# Patient Record
Sex: Female | Born: 1965 | Race: White | Hispanic: No | Smoking: Current every day smoker
Health system: Southern US, Community
[De-identification: ages and names within clinical notes are randomized; demographics above are authoritative.]

## PROBLEM LIST (undated history)

## (undated) VITALS — BP 128/84 | HR 73 | Temp 97.4°F | Resp 16 | Ht 60.0 in | Wt 152.0 lb

## (undated) DIAGNOSIS — F32A Depression, unspecified: Secondary | ICD-10-CM

## (undated) DIAGNOSIS — F1010 Alcohol abuse, uncomplicated: Secondary | ICD-10-CM

## (undated) DIAGNOSIS — F329 Major depressive disorder, single episode, unspecified: Secondary | ICD-10-CM

## (undated) DIAGNOSIS — I509 Heart failure, unspecified: Secondary | ICD-10-CM

## (undated) DIAGNOSIS — E785 Hyperlipidemia, unspecified: Secondary | ICD-10-CM

## (undated) DIAGNOSIS — J449 Chronic obstructive pulmonary disease, unspecified: Secondary | ICD-10-CM

## (undated) DIAGNOSIS — R011 Cardiac murmur, unspecified: Secondary | ICD-10-CM

## (undated) HISTORY — PX: EXPLORATION POST OPERATIVE OPEN HEART: SHX5061

## (undated) HISTORY — DX: Chronic obstructive pulmonary disease, unspecified: J44.9

## (undated) HISTORY — PX: DILATION AND CURETTAGE OF UTERUS: SHX78

## (undated) HISTORY — DX: Cardiac murmur, unspecified: R01.1

## (undated) HISTORY — DX: Heart failure, unspecified: I50.9

## (undated) HISTORY — DX: Hyperlipidemia, unspecified: E78.5

---

## 2000-06-08 ENCOUNTER — Emergency Department (HOSPITAL_COMMUNITY): Admission: EM | Admit: 2000-06-08 | Discharge: 2000-06-09 | Payer: Self-pay | Admitting: *Deleted

## 2004-09-10 ENCOUNTER — Ambulatory Visit: Payer: Self-pay | Admitting: Psychiatry

## 2004-10-31 ENCOUNTER — Inpatient Hospital Stay: Payer: Self-pay | Admitting: Psychiatry

## 2004-11-05 ENCOUNTER — Ambulatory Visit: Payer: Self-pay | Admitting: Unknown Physician Specialty

## 2009-05-31 ENCOUNTER — Emergency Department (HOSPITAL_COMMUNITY): Admission: EM | Admit: 2009-05-31 | Discharge: 2009-06-01 | Payer: Self-pay | Admitting: Emergency Medicine

## 2009-06-01 ENCOUNTER — Ambulatory Visit: Payer: Self-pay | Admitting: Psychiatry

## 2009-06-01 ENCOUNTER — Inpatient Hospital Stay (HOSPITAL_COMMUNITY): Admission: AD | Admit: 2009-06-01 | Discharge: 2009-06-07 | Payer: Self-pay | Admitting: Psychiatry

## 2010-03-23 LAB — HEPATIC FUNCTION PANEL
ALT: 48 U/L — ABNORMAL HIGH (ref 0–35)
AST: 41 U/L — ABNORMAL HIGH (ref 0–37)
Bilirubin, Direct: 0.1 mg/dL (ref 0.0–0.3)
Total Bilirubin: 0.5 mg/dL (ref 0.3–1.2)

## 2010-03-23 LAB — BASIC METABOLIC PANEL
GFR calc Af Amer: >60 mL/min (ref >60)
GFR calc non Af Amer: >60 mL/min (ref >60)
Potassium: 3.6 mEq/L (ref 3.5–5.1)
Sodium: 136 mEq/L (ref 135–145)

## 2010-03-23 LAB — CBC
HCT: 41.1 % (ref 36.0–46.0)
Hemoglobin: 14.2 g/dL (ref 12.0–15.0)
Platelets: 190 10*3/uL (ref 150–400)
WBC: 8.2 10*3/uL (ref 4.0–10.5)

## 2010-03-23 LAB — DIFFERENTIAL
Eosinophils Relative: 1 % (ref 0–5)
Lymphocytes Relative: 39 % (ref 12–46)
Lymphs Abs: 3.1 10*3/uL (ref 0.7–4.0)
Monocytes Absolute: 0.2 10*3/uL (ref 0.1–1.0)

## 2010-03-23 LAB — ETHANOL: Alcohol, Ethyl (B): 156 mg/dL — ABNORMAL HIGH (ref 0–10)

## 2010-03-23 LAB — RAPID URINE DRUG SCREEN, HOSP PERFORMED: Tetrahydrocannabinol: NOT DETECTED

## 2010-03-23 LAB — ACETAMINOPHEN LEVEL: Acetaminophen (Tylenol), Serum: <10 ug/mL — ABNORMAL LOW (ref 10–30)

## 2010-09-10 ENCOUNTER — Other Ambulatory Visit: Payer: Self-pay

## 2010-09-10 ENCOUNTER — Inpatient Hospital Stay (HOSPITAL_COMMUNITY): Admission: AD | Admit: 2010-09-10 | Payer: 59 | Admitting: Psychiatry

## 2010-09-10 ENCOUNTER — Emergency Department (HOSPITAL_COMMUNITY)
Admission: EM | Admit: 2010-09-10 | Discharge: 2010-09-10 | Disposition: A | Discharge status: Home / Self Care | Payer: Self-pay | Attending: Emergency Medicine | Admitting: Emergency Medicine

## 2010-09-10 DIAGNOSIS — F1010 Alcohol abuse, uncomplicated: Secondary | ICD-10-CM

## 2010-09-10 DIAGNOSIS — F329 Major depressive disorder, single episode, unspecified: Secondary | ICD-10-CM | POA: Insufficient documentation

## 2010-09-10 DIAGNOSIS — F3289 Other specified depressive episodes: Secondary | ICD-10-CM | POA: Insufficient documentation

## 2010-09-10 DIAGNOSIS — R45851 Suicidal ideations: Secondary | ICD-10-CM

## 2010-09-10 DIAGNOSIS — F10929 Alcohol use, unspecified with intoxication, unspecified: Secondary | ICD-10-CM | POA: Diagnosis present

## 2010-09-10 DIAGNOSIS — F32A Depression, unspecified: Secondary | ICD-10-CM

## 2010-09-10 HISTORY — DX: Depression, unspecified: F32.A

## 2010-09-10 HISTORY — DX: Major depressive disorder, single episode, unspecified: F32.9

## 2010-09-10 HISTORY — DX: Alcohol abuse, uncomplicated: F10.10

## 2010-09-10 LAB — COMPREHENSIVE METABOLIC PANEL
ALT: 15 U/L (ref 0–35)
Alkaline Phosphatase: 78 U/L (ref 39–117)
CO2: 23 mEq/L (ref 19–32)
Calcium: 8.3 mg/dL — ABNORMAL LOW (ref 8.4–10.5)
Chloride: 108 mEq/L (ref 96–112)
GFR calc Af Amer: >60 mL/min (ref >60)
GFR calc non Af Amer: >60 mL/min (ref >60)
Glucose, Bld: 97 mg/dL (ref 70–99)
Sodium: 145 mEq/L (ref 135–145)
Total Bilirubin: 0.1 mg/dL — ABNORMAL LOW (ref 0.3–1.2)

## 2010-09-10 LAB — RAPID URINE DRUG SCREEN, HOSP PERFORMED
Barbiturates: NOT DETECTED
Opiates: NOT DETECTED
Tetrahydrocannabinol: NOT DETECTED

## 2010-09-10 LAB — CBC
Hemoglobin: 13 g/dL (ref 12.0–15.0)
MCH: 34 pg (ref 26.0–34.0)
RBC: 3.82 MIL/uL — ABNORMAL LOW (ref 3.87–5.11)

## 2010-09-10 LAB — GLUCOSE, CAPILLARY: Glucose-Capillary: 105 mg/dL — ABNORMAL HIGH (ref 70–99)

## 2010-09-10 LAB — ETHANOL: Alcohol, Ethyl (B): 374 mg/dL — ABNORMAL HIGH (ref 0–11)

## 2010-09-10 MED ORDER — ALUM & MAG HYDROXIDE-SIMETH 200-200-20 MG/5ML PO SUSP: 30.0000 mL | ORAL | Status: DC | PRN

## 2010-09-10 MED ORDER — ZOLPIDEM TARTRATE 5 MG PO TABS: 10.0000 mg | ORAL_TABLET | Freq: Every evening | ORAL | Status: DC | PRN

## 2010-09-10 MED ORDER — ZIPRASIDONE MESYLATE 20 MG IM SOLR
INTRAMUSCULAR | Status: AC
  Administered 2010-09-10: 20 mg via INTRAMUSCULAR
  Filled 2010-09-10: qty 20

## 2010-09-10 MED ORDER — IBUPROFEN 400 MG PO TABS: 600.0000 mg | ORAL_TABLET | Freq: Three times a day (TID) | ORAL | Status: DC | PRN

## 2010-09-10 MED ORDER — ONDANSETRON HCL 4 MG PO TABS: 4.0000 mg | ORAL_TABLET | Freq: Three times a day (TID) | ORAL | Status: DC | PRN

## 2010-09-10 MED ORDER — ACETAMINOPHEN 325 MG PO TABS: 650.0000 mg | ORAL_TABLET | ORAL | Status: DC | PRN

## 2010-09-10 MED ORDER — NICOTINE 21 MG/24HR TD PT24: 21.0000 mg | MEDICATED_PATCH | Freq: Every day | TRANSDERMAL | Status: DC

## 2010-09-10 MED ORDER — ZIPRASIDONE MESYLATE 20 MG IM SOLR
20.0000 mg | Freq: Once | INTRAMUSCULAR | Status: AC
  Administered 2010-09-10: 20 mg via INTRAMUSCULAR

## 2010-09-10 MED ORDER — LORAZEPAM 1 MG PO TABS: 1.0000 mg | ORAL_TABLET | Freq: Three times a day (TID) | ORAL | Status: DC | PRN

## 2010-09-10 MED ORDER — SODIUM CHLORIDE 0.9 % IV BOLUS (SEPSIS)
1000.0000 mL | Freq: Once | INTRAVENOUS | Status: AC
  Administered 2010-09-10: 1000 mL via INTRAVENOUS

## 2010-09-10 MED ORDER — SODIUM CHLORIDE 0.9 % IV SOLN: INTRAVENOUS | Status: DC

## 2010-09-10 NOTE — ED Notes (Signed)
Pt taken out of restraints. Verbalizes understanding of why they were placed. Agrees to not pull at IV and foley catheter.

## 2010-09-10 NOTE — ED Notes (Signed)
edp in with pt 

## 2010-09-10 NOTE — ED Provider Notes (Cosign Needed Addendum)
History   Chart scribed for Ward Givens, MD by Enos Fling; the patient was seen in room APA02/APA02; this patient's care was started at 11:30 AM.    CSN: [id number] Arrival date & time: 09/10/2010 11:21 AM  Chief Complaint  Patient presents with  . Drug Overdose   HPI - Pt seen at 11:30 AM. Level 5 caveat for pt's mental status and uncooperativeness. Alison Kennedy is a 45 y.o. female BIB parents who presents to the Emergency Department for medical clearance. Mother reports pt called her this AM tearful and asking for help. Mom reports pt has been drinking alcohol this AM but unsure if pt has taken any other drugs or meds today. Pt h/o depression. Parents state pt has been admitted for psychiatric reasons twice previously, once at [facility] and once at [facility], most recently in June for 30 days. MOP not aware of any drug use.  Past Medical History  Diagnosis Date  . Depression   . Alcohol abuse    PMH Depression, alcoholism.   History reviewed. No pertinent past surgical history.  No family history on file.  History  Substance Use Topics  . Smoking status: amokes  . Smokeless tobacco: Not on file  . Alcohol Use: heavy  Drug and alcohol use Employed, Divorced OB History    Grav Para Term Preterm Abortions TAB SAB Ect Mult Living                  Review of Systems  Unable to perform ROS: Psychiatric disorder    Physical Exam  BP 92/56  Pulse 76  Temp(Src) 97.9 F (36.6 C) (Oral)  Resp 16  SpO2 100%  Physical Exam  Constitutional: She appears well-developed and well-nourished.  HENT:  Head: Normocephalic and atraumatic.  Eyes: Conjunctivae and EOM are normal. Pupils are equal, round, and reactive to light.  Neck: Normal range of motion.  Cardiovascular: Normal rate, regular rhythm and normal heart sounds.   Pulmonary/Chest: Effort normal and breath sounds normal.  Abdominal: Soft.  Musculoskeletal: Normal range of motion.  Neurological: She is alert.  She has normal strength. No cranial nerve deficit.  Skin: Skin is warm and dry.  Psychiatric: Her speech is normal. Her affect is labile. She is agitated and aggressive. Cognition and memory are impaired.       Pt is screaming for her parents who are at her bedside, crying,  combative, is not consolable, will not answer questions directed at her    ED Course  Procedures  OTHER DATA REVIEWED: Nursing notes and vital signs reviewed. Prior records reviewed.    Date: 09/10/2010  Rate: 100  Rhythm: normal sinus rhythm  QRS Axis: right  Intervals: IRBBB  ST/T Wave abnormalities: nonspecific ST/T changes  Conduction Disutrbances:nonspecific intraventricular conduction delay  Narrative Interpretation:   Old EKG Reviewed: unchanged since 05/31/2009   LABS / RADIOLOGY:   ED COURSE: 3:40 PM - Pt recheck, pt awake and calm at this time, states she feels drowsy and does not remember what happened today. Results discussed, pt reports when she drinks she usually drink an entire bottle of liquor (a fifth). Pt admits to h/o SI, would take pills to "go to sleep and not wake up," mom reports pt h/o OD on pills June 2011 and was admitted to [facility] for 3 weeks at that time. Denies HI or hallucinations. Sees psychiatrist Tiburcio Pea) and a therapist approx once a month. MOP keeps repeating "She is living in the past". States patient  hasn't recovered from her parents divorce 30 years ago and her own divorce many years ago.   3:54 PM - Spoke with Tommy with [facility], Samson Frederic with [facility] will see pt in ED. States her ETOH will need to be down to about 150 before she can be placed anywhere.     MDM: Results for orders placed during the hospital encounter of 09/10/10  CBC      Component Value Range   WBC 7.0  4.0 - 10.5 (K/uL)   RBC 3.82 (*) 3.87 - 5.11 (MIL/uL)   Hemoglobin 13.0  12.0 - 15.0 (g/dL)   HCT 16.1  09.6 - 04.5 (%)   MCV 101.0 (*) 78.0 - 100.0 (fL)   MCH 34.0  26.0 - 34.0 (pg)   MCHC 33.7  30.0  - 36.0 (g/dL)   RDW 40.9  81.1 - 91.4 (%)   Platelets 205  150 - 400 (K/uL)  COMPREHENSIVE METABOLIC PANEL      Component Value Range   Sodium 145  135 - 145 (mEq/L)   Potassium 3.7  3.5 - 5.1 (mEq/L)   Chloride 108  96 - 112 (mEq/L)   CO2 23  19 - 32 (mEq/L)   Glucose, Bld 97  70 - 99 (mg/dL)   BUN 18  6 - 23 (mg/dL)   Creatinine, Ser 7.82 (*) 0.50 - 1.10 (mg/dL)   Calcium 8.3 (*) 8.4 - 10.5 (mg/dL)   Total Protein 6.8  6.0 - 8.3 (g/dL)   Albumin 4.0  3.5 - 5.2 (g/dL)   AST 16  0 - 37 (U/L)   ALT 15  0 - 35 (U/L)   Alkaline Phosphatase 78  39 - 117 (U/L)   Total Bilirubin 0.1 (*) 0.3 - 1.2 (mg/dL)   GFR calc non Af Amer >60  >60 (mL/min)   GFR calc Af Amer >60  >60 (mL/min)  ACETAMINOPHEN LEVEL      Component Value Range   Acetaminophen (Tylenol), Serum <15.0  10 - 30 (ug/mL)  SALICYLATE LEVEL      Component Value Range   Salicylate Lvl <2.0 (*) 2.8 - 20.0 (mg/dL)  URINE RAPID DRUG SCREEN (HOSP PERFORMED)      Component Value Range   Opiates NONE DETECTED  NONE DETECTED    Cocaine NONE DETECTED  NONE DETECTED    Benzodiazepines NONE DETECTED  NONE DETECTED    Amphetamines NONE DETECTED  NONE DETECTED    Tetrahydrocannabinol NONE DETECTED  NONE DETECTED    Barbiturates NONE DETECTED  NONE DETECTED   ETHANOL      Component Value Range   Alcohol, Ethyl (B) 374 (*) 0 - 11 (mg/dL)  GLUCOSE, CAPILLARY      Component Value Range   Glucose-Capillary 105 (*) 70 - 99 (mg/dL)   Labs intoxicated otherwise normal.  IMPRESSION: Alcohol intoxication/abuse Depression SI  MEDS GIVEN IN ED:  Medications  0.9 %  sodium chloride infusion (not administered)  sodium chloride 0.9 % bolus 1,000 mL (1000 mL Intravenous Given 09/10/10 1145)  ziprasidone (GEODON) injection 20 mg (20 mg Intramuscular Given 09/10/10 1137)     I personally performed the services described in this documentation, which was scribed in my presence. The recorded information has been reviewed and considered. Devoria Albe, MD, FACEP    Ward Givens, MD 09/10/10 1557  Ward Givens, MD 09/10/10 9562  Ward Givens, MD 09/10/10 1308

## 2010-09-10 NOTE — ED Notes (Signed)
Pt stable, awaiting [facility] eval, pt intoxicated Updated family at this time  Joya Gaskins, MD 09/10/10 1731

## 2010-09-10 NOTE — ED Notes (Signed)
Foley removed at pt's request

## 2010-09-10 NOTE — ED Notes (Signed)
Step dad, Gabriel Rung states they are going home for a while. Can be reached at [phone]. Would like to be called when pt is to be eval by [facility]

## 2010-09-10 NOTE — ED Notes (Signed)
Pt awake now. Pt to be eval by [facility]. Mother states she is to have a stress test tomorrow and she cannot stay here all night

## 2010-09-10 NOTE — ED Notes (Signed)
Pt arrived to ED, disoriented, uncooperative.  Her parents brought her here.  Mother says pt lives with her grandmother and she called her mother this morning crying asking for help.  Mother says pt goes to mental health because she has depression.  Mother says pt "lives in the past."  Mother and father say pt has been drinking this am but unsure if took any medications.  PT screaming and crying when her parents leave the room.  When ask pt any questions, pt says "i don't know."

## 2010-09-10 NOTE — ED Notes (Signed)
Pt arrives uncooperative and irritated into room. Pt yelling, kicking and fighting with staff. Family at bedside. Pt unable to follow commands, refuses to answer questions. MD at bedside. Orders received for 4 point restraints and Geodon 20 mg IM. Pt placed in restraints at 11:30 am. Pt continues to yell, crying for parents who are at bedside.

## 2010-09-10 NOTE — ED Notes (Signed)
Pt now sober Denies SI Has no access to weapons Lives with grandmother Mother at bedside, feels pt is safe for d/c and close f/u with therapist tomorrow Mother was just concerned about her etoh use and pt does not want detox/inpatient treatment Pt seen by [facility], contracted for safety  Joya Gaskins, MD 09/10/10 2047

## 2010-12-04 ENCOUNTER — Emergency Department (HOSPITAL_COMMUNITY)
Admission: EM | Admit: 2010-12-04 | Discharge: 2010-12-04 | Disposition: A | Discharge status: Home / Self Care | Payer: Self-pay | Attending: Emergency Medicine | Admitting: Emergency Medicine

## 2010-12-04 ENCOUNTER — Emergency Department (HOSPITAL_COMMUNITY): Payer: Self-pay

## 2010-12-04 ENCOUNTER — Encounter (HOSPITAL_COMMUNITY): Payer: Self-pay | Admitting: *Deleted

## 2010-12-04 DIAGNOSIS — J3489 Other specified disorders of nose and nasal sinuses: Secondary | ICD-10-CM | POA: Insufficient documentation

## 2010-12-04 DIAGNOSIS — IMO0001 Reserved for inherently not codable concepts without codable children: Secondary | ICD-10-CM | POA: Insufficient documentation

## 2010-12-04 DIAGNOSIS — R059 Cough, unspecified: Secondary | ICD-10-CM | POA: Insufficient documentation

## 2010-12-04 DIAGNOSIS — F329 Major depressive disorder, single episode, unspecified: Secondary | ICD-10-CM | POA: Insufficient documentation

## 2010-12-04 DIAGNOSIS — R05 Cough: Secondary | ICD-10-CM | POA: Insufficient documentation

## 2010-12-04 DIAGNOSIS — F1021 Alcohol dependence, in remission: Secondary | ICD-10-CM | POA: Insufficient documentation

## 2010-12-04 DIAGNOSIS — R509 Fever, unspecified: Secondary | ICD-10-CM | POA: Insufficient documentation

## 2010-12-04 DIAGNOSIS — F3289 Other specified depressive episodes: Secondary | ICD-10-CM | POA: Insufficient documentation

## 2010-12-04 DIAGNOSIS — F17200 Nicotine dependence, unspecified, uncomplicated: Secondary | ICD-10-CM | POA: Insufficient documentation

## 2010-12-04 MED ORDER — AZITHROMYCIN 250 MG PO TABS: ORAL_TABLET | ORAL | Status: DC

## 2010-12-04 MED ORDER — GUAIFENESIN-CODEINE 100-10 MG/5ML PO SYRP: 10.0000 mL | ORAL_SOLUTION | Freq: Three times a day (TID) | ORAL | Status: AC | PRN

## 2010-12-04 MED ORDER — ALBUTEROL SULFATE HFA 108 (90 BASE) MCG/ACT IN AERS
2.0000 | INHALATION_SPRAY | Freq: Once | RESPIRATORY_TRACT | Status: AC
  Administered 2010-12-04: 2 via RESPIRATORY_TRACT
  Filled 2010-12-04: qty 6.7

## 2010-12-04 NOTE — ED Provider Notes (Signed)
History     CSN: [id number] Arrival date & time: 12/04/2010 11:02 AM   First MD Initiated Contact with Patient 12/04/10 1058      Chief Complaint  Patient presents with  . Fever  . Cough  . Generalized Body Aches    (Consider location/radiation/quality/duration/timing/severity/associated sxs/prior treatment) Patient is a 45 y.o. female presenting with cough. The history is provided by the patient.  Cough This is a new problem. The current episode started yesterday. The problem occurs every few minutes. The problem has not changed since onset.The cough is productive of sputum. The maximum temperature recorded prior to her arrival was 100 to 100.9 F. The fever has been present for less than 1 day. Associated symptoms include chills, rhinorrhea and myalgias. Pertinent negatives include no chest pain, no sweats, no ear congestion, no ear pain, no sore throat, no shortness of breath, no wheezing and no eye redness. She has tried nothing for the symptoms. She is a smoker. Her past medical history is significant for bronchitis. Her past medical history does not include pneumonia or asthma.    Past Medical History  Diagnosis Date  . Depression   . Alcohol abuse     Past Surgical History  Procedure Date  . 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 No pertinent family history.  History  Substance Use Topics  . Smoking status: Current Everyday Smoker -- 1.0 packs/day    Types: Cigarettes  . Smokeless tobacco: Not on file  . Alcohol Use: No     fomer heavy drinker    OB History    Grav Para Term Preterm Abortions TAB SAB Ect Mult Living                  Review of Systems  Constitutional: Positive for fever and chills. Negative for activity change, appetite change and fatigue.  HENT: Positive for congestion and rhinorrhea. Negative for ear pain, sore throat, facial swelling, trouble swallowing, neck pain, neck stiffness and sinus  pressure.   Eyes: Negative for redness.  Respiratory: Positive for cough. Negative for shortness of breath and wheezing.   Cardiovascular: Negative for chest pain and palpitations.  Gastrointestinal: Negative for nausea, vomiting and abdominal pain.  Genitourinary: Negative for dysuria.  Musculoskeletal: Positive for myalgias. Negative for back pain and arthralgias.  Skin: Negative for rash.  Neurological: Negative for dizziness, weakness and numbness.  Hematological: Does not bruise/bleed easily.  All other systems reviewed and are negative.    Allergies  Review of patient's allergies indicates no known allergies.  Home Medications   Current Outpatient Rx  Name Route Sig Dispense Refill  . ARIPIPRAZOLE 2 MG PO TABS Oral Take 2 mg by mouth daily.      Marland Kitchen FLUOXETINE HCL 40 MG PO CAPS Oral Take 40 mg by mouth daily.      Marland Kitchen LORAZEPAM 1 MG PO TABS Oral Take 0.5-1 mg by mouth 4 (four) times daily as needed.      Marland Kitchen ZOLPIDEM TARTRATE 10 MG PO TABS Oral Take 10 mg by mouth at bedtime.        BP 105/53  Pulse 87  Temp 99.3 F (37.4 C)  Resp 20  Ht 5' (1.524 m)  Wt 120 lb (54.432 kg)  BMI 23.44 kg/m2  SpO2 100%  Physical Exam  Nursing note and vitals reviewed. Constitutional: She is oriented to person, place, and time. She appears well-developed and well-nourished. No distress.  HENT:  Head: Normocephalic and atraumatic.  Right Ear: Tympanic membrane and ear canal normal.  Left Ear: Tympanic membrane and ear canal normal.  Mouth/Throat: Uvula is midline, oropharynx is clear and moist and mucous membranes are normal.  Eyes: EOM are normal.  Neck: Normal range of motion. Neck supple.  Cardiovascular: Normal rate, regular rhythm and normal heart sounds.   Pulmonary/Chest: Effort normal. No respiratory distress. She has no decreased breath sounds. She has wheezes. She has no rhonchi. She has no rales. She exhibits no tenderness.       Coarse lung sounds through out  With scattered  expir and inspir wheezes.  Abdominal: Soft. She exhibits no distension. There is no tenderness.  Musculoskeletal: Normal range of motion. She exhibits no tenderness.  Lymphadenopathy:    She has no cervical adenopathy.  Neurological: She is alert and oriented to person, place, and time. No cranial nerve deficit. She exhibits normal muscle tone. Coordination normal.  Skin: Skin is warm and dry.  Psychiatric: She has a normal mood and affect.    ED Course  Procedures (including critical care time)  Labs Reviewed - No data to display Dg Chest 2 View  12/04/2010  *RADIOLOGY REPORT*  Clinical Data: Cough, fever  CHEST - 2 VIEW  Comparison: None.  Findings: The lungs are clear and slightly hyperaerated. Mediastinal contours appear normal.  The heart is within normal limits in size.  There are mediastinal sutures present.  No acute bony abnormality is seen.  IMPRESSION: No active lung disease.  Median sternotomy sutures noted.  Original Report Authenticated By: Juline Patch, M.D.        MDM    11:36 AM patient is alert, NAD.  Non-toxic appearing.  No tachycardia, tachypnea or hypoxia.  Coarse lung sounds through out  With scattered expir and inspir wheezes.  Will dispense MDI and treat will cough med and zithromax        Nain Rudd L. [place], [place] 12/06/10 [phone]

## 2010-12-04 NOTE — ED Notes (Signed)
C/o productive cough, fever, body aches onset yesterday

## 2010-12-07 NOTE — ED Provider Notes (Signed)
Medical screening examination/treatment/procedure(s) were performed by non-physician practitioner and as supervising physician I was immediately available for consultation/collaboration.   Lexander Tremblay W. Ram Haugan, MD 12/07/10 1142 

## 2011-02-23 ENCOUNTER — Other Ambulatory Visit (HOSPITAL_COMMUNITY): Payer: Self-pay | Admitting: Family Medicine

## 2011-02-23 DIAGNOSIS — Z139 Encounter for screening, unspecified: Secondary | ICD-10-CM

## 2011-03-01 ENCOUNTER — Institutional Professional Consult (permissible substitution): Payer: Self-pay | Admitting: Internal Medicine

## 2011-03-02 ENCOUNTER — Ambulatory Visit (HOSPITAL_COMMUNITY): Payer: Self-pay

## 2011-03-04 ENCOUNTER — Ambulatory Visit (HOSPITAL_COMMUNITY)
Admission: RE | Admit: 2011-03-04 | Discharge: 2011-03-04 | Disposition: A | Discharge status: Home / Self Care | Payer: PRIVATE HEALTH INSURANCE | Source: Ambulatory Visit | Attending: Family Medicine | Admitting: Family Medicine

## 2011-03-04 DIAGNOSIS — Z139 Encounter for screening, unspecified: Secondary | ICD-10-CM

## 2011-03-19 ENCOUNTER — Encounter: Payer: Self-pay | Admitting: Internal Medicine

## 2011-03-19 ENCOUNTER — Ambulatory Visit (INDEPENDENT_AMBULATORY_CARE_PROVIDER_SITE_OTHER): Payer: Self-pay | Admitting: Internal Medicine

## 2011-03-19 VITALS — BP 108/68 | HR 70 | Temp 97.5°F | Ht 60.0 in | Wt 147.4 lb

## 2011-03-19 DIAGNOSIS — Z0289 Encounter for other administrative examinations: Secondary | ICD-10-CM

## 2011-03-19 DIAGNOSIS — J449 Chronic obstructive pulmonary disease, unspecified: Secondary | ICD-10-CM | POA: Insufficient documentation

## 2011-03-19 DIAGNOSIS — R0602 Shortness of breath: Secondary | ICD-10-CM

## 2011-03-19 DIAGNOSIS — F17200 Nicotine dependence, unspecified, uncomplicated: Secondary | ICD-10-CM | POA: Insufficient documentation

## 2011-03-19 DIAGNOSIS — Z024 Encounter for examination for driving license: Secondary | ICD-10-CM | POA: Insufficient documentation

## 2011-03-19 NOTE — Patient Instructions (Signed)
#  DMV I have filled out the DMV form for interlock device #COPD v Asthma You have severe obstructive lung disease in the form of asthma or copd (copd is from smoking) Start advair samples I gave your 2 puff twice daily  Your lung function can improve with inhaler treatment Because this runs your family when you return you need genetic test alpha 1 test for copd #Smoking   Please work on quitting smoking #Followup  3 months with spirometry and alpha 1 at followup

## 2011-03-19 NOTE — Assessment & Plan Note (Signed)
Strongly counseled to quit . SHe will start once legal issues sorted out

## 2011-03-19 NOTE — Progress Notes (Addendum)
Subjective:    Patient ID: Alison Kennedy, female    DOB: 07-29-1965, 46 y.o.   MRN: [id number]  HPI  IOV 03/19/2011   46 year old female. Body mass index is 28.79 kg/(m^2).  reports that she has been smoking Cigarettes.  She has a 60 pack-year smoking history. She does not have any smokeless tobacco history on file. She is here with mother. Issue is that she had DUI and lost Driver License in summer 3329 or so per hx. She is trying to get this reinstated. She needs 2nd spirometry per [facility] regulations per her hx. She has seen Dr Juanetta Gosling on 02/25/11 and Spirometry with Dr Juanetta Gosling at [facility] 02/25/11 showed severe obstruction with Fev1 0.86L/36%, FVC 1.4L/46%, Ratio 62. Dr Juanetta Gosling has certified that she cannot use the interlock device. Currently feels stable. She says last etoh was in sept Jun 07, 2010 when son shot hiimself dead (currently grieving and coping). She still smokes heavily. Since son death gained 20# weight.   Reports insidious onset dyspnea a year ago, worse past few months since weight gain. Exertional when she carries wood (class 2), Relieved by rest. No associated chest pain, cough, edema, orthopnea, paroxysmal nocturnal dyspnea but has 20# weight gain. REcollects normal cxr past year at urgent care in [place]. There is poisitive family hx of copd in grandfather. I have reviewed DMV regulations on website and listed it below.  Spirometry 03/19/2011 at our office  - fev1 1.11L/46%, FVC 1.67L/58%, Ratio 67  Walking test today 03/19/2011 at our office  185 feet x 3 laps: lowest pulse ox 91%, HR 72 -> 127   State law on this from website  [place].8.html   Medical Exception to Requirement. - A person subject to this section who has a medically diagnosed physical condition that makes the person incapable of personally activating an ignition interlock system may request an exception to the requirements of this  section from the Division. The Division shall not issue an exception to this section unless the person has submitted to a physical examination by two or more physicians or surgeons duly licensed to practice medicine in this State or in any other state of the [place] and unless such examining physicians or surgeons have completed and signed a certificate in the form prescribed by the Division. Such certificate shall be devised by the Commissioner with the advice of those qualified experts in the field of diagnosing and treating physical disorders that the Commissioner may select and shall be designed to elicit the maximum medical information necessary to aid in determining whether or not the person is capable of personally activating an ignition interlock system. The certificate shall contain a waiver of privilege and the recommendation of the examining physician to the Commissioner as to whether the person is capable of personally activating an ignition interlock system. The Commissioner is not bound by the recommendations of the examining physicians but shall give fair consideration to such recommendations in acting upon the request for medical exception, the criterion being whether or not, upon all the evidence, it appears that the person is in fact incapable of personally activating an ignition interlock system. The burden of proof of such fact is upon the person seeking the exception. Whenever an exception is denied by the Commissioner, such denial may be reviewed by a reviewing board upon written request of the person seeking the exception filed with the Division within 10 days after receipt of such denial. The composition, procedures, and review of the reviewing  board shall be as provided in G.[place]. 20-9(g)(4).  [phone], s. 3; 2000-155, ss. 1-3; 2001-487, s. 8; 2006-253, ss. 22.3, 22.4; 2007-493, ss. 5, 10, 28; 2009-369, ss. 5, 6; 2011-191, s. 3.)   Past Medical History  Diagnosis Date  .  Depression   . Alcohol abuse   . Heart murmur      Family History  Problem Relation Age of Onset  . Asthma Son   . Asthma Brother   . Heart disease Maternal Grandfather   . COPD Maternal Grandfather      History   Social History  . Marital Status: Legally Separated    Spouse Name: N/A    Number of Children: N/A  . Years of Education: N/A   Occupational History  . Not on file.   Social History Main Topics  . Smoking status: Current Everyday Smoker -- 2.0 packs/day for 30 years    Types: Cigarettes  . Smokeless tobacco: Not on file  . Alcohol Use: Yes     fomer heavy drinker  . Drug Use: No     per pt's father  . Sexually Active: Not on file   Other Topics Concern  . Not on file   Social History Narrative  . No narrative on file     No Known Allergies   Outpatient Prescriptions Prior to Visit  Medication Sig Dispense Refill  . ARIPiprazole (ABILIFY) 2 MG tablet Take 2 mg by mouth daily.        Marland Kitchen azithromycin (ZITHROMAX Z-PAK) 250 MG tablet Take two tablets on day one, then one tab qd days 2-5  6 tablet  0  . FLUoxetine (PROZAC) 40 MG capsule Take 40 mg by mouth daily.        Marland Kitchen LORazepam (ATIVAN) 1 MG tablet Take 0.5-1 mg by mouth 4 (four) times daily as needed.        . zolpidem (AMBIEN) 10 MG tablet Take 10 mg by mouth at bedtime.            Review of Systems  Constitutional: Negative for fever and unexpected weight change.  HENT: Negative for ear pain, nosebleeds, congestion, sore throat, rhinorrhea, sneezing, trouble swallowing, dental problem, postnasal drip and sinus pressure.   Eyes: Negative for redness and itching.  Respiratory: Positive for shortness of breath. Negative for cough, chest tightness and wheezing.   Cardiovascular: Negative for palpitations and leg swelling.  Gastrointestinal: Negative for nausea and vomiting.  Genitourinary: Negative for dysuria.  Musculoskeletal: Negative for joint swelling.  Skin: Negative for rash.  Neurological:  Negative for headaches.  Hematological: Does not bruise/bleed easily.  Psychiatric/Behavioral: Negative for dysphoric mood. The patient is not nervous/anxious.        Objective:   Physical Exam  Vitals reviewed. Constitutional: She is oriented to person, place, and time. She appears well-developed and well-nourished. No distress.       Body mass index is 28.79 kg/(m^2).   HENT:  Head: Normocephalic and atraumatic.  Right Ear: External ear normal.  Left Ear: External ear normal.  Mouth/Throat: Oropharynx is clear and moist. No oropharyngeal exudate.  Eyes: Conjunctivae and EOM are normal. Pupils are equal, round, and reactive to light. Right eye exhibits no discharge. Left eye exhibits no discharge. No scleral icterus.  Neck: Normal range of motion. Neck supple. No JVD present. No tracheal deviation present. No thyromegaly present.  Cardiovascular: Normal rate, regular rhythm, normal heart sounds and intact distal pulses.  Exam reveals no gallop and no friction rub.  No murmur heard. Pulmonary/Chest: Effort normal and breath sounds normal. No respiratory distress. She has no wheezes. She has no rales. She exhibits no tenderness.  Abdominal: Soft. Bowel sounds are normal. She exhibits no distension and no mass. There is no tenderness. There is no rebound and no guarding.  Musculoskeletal: Normal range of motion. She exhibits no edema and no tenderness.  Lymphadenopathy:    She has no cervical adenopathy.  Neurological: She is alert and oriented to person, place, and time. She has normal reflexes. No cranial nerve deficit. She exhibits normal muscle tone. Coordination normal.  Skin: Skin is warm and dry. No rash noted. She is not diaphoretic. No erythema. No pallor.  Psychiatric: She has a normal mood and affect. Her behavior is normal. Judgment and thought content normal.       Slight flat affect          Assessment & Plan:

## 2011-03-19 NOTE — Assessment & Plan Note (Signed)
I am not sure what the fev1 threshold for interlock ignitiion device is. But reading [place] and [place] state website and generally in google it appears that they can adjust device for her lung function. She has severe obstruction in an untreated state but does not desaturate with exertion. So, I suspct she cannot use the device and will need lowering the threshold. The state can decide this. She might do better on inhalers which we started with samples. Form filled out

## 2011-03-19 NOTE — Assessment & Plan Note (Signed)
Sever obstruction on 2 x spirometry wihtout desaturation. Family hx of copd +. She is not interested in focusing on her lung health due to [facility] issues and "need to get my life together".  I have advisd her and mom that this is serious lung function impairment at young age. $ issues +. So wil help out with sample advair. Counseled on need for genetic alpha 1 test. After counseling they agreed. ROV 3 months with alpha 1 and spirometry to see response on sample advair

## 2011-05-13 ENCOUNTER — Encounter (HOSPITAL_COMMUNITY): Payer: Self-pay

## 2011-05-13 ENCOUNTER — Emergency Department (HOSPITAL_COMMUNITY)
Admission: EM | Admit: 2011-05-13 | Discharge: 2011-05-14 | Disposition: A | Discharge status: Other Acute Inpatient Hospital | Payer: Self-pay | Attending: Emergency Medicine | Admitting: Emergency Medicine

## 2011-05-13 DIAGNOSIS — I959 Hypotension, unspecified: Secondary | ICD-10-CM | POA: Insufficient documentation

## 2011-05-13 DIAGNOSIS — T43502A Poisoning by unspecified antipsychotics and neuroleptics, intentional self-harm, initial encounter: Secondary | ICD-10-CM | POA: Insufficient documentation

## 2011-05-13 DIAGNOSIS — R4182 Altered mental status, unspecified: Secondary | ICD-10-CM | POA: Insufficient documentation

## 2011-05-13 DIAGNOSIS — T50901A Poisoning by unspecified drugs, medicaments and biological substances, accidental (unintentional), initial encounter: Secondary | ICD-10-CM

## 2011-05-13 DIAGNOSIS — T43294A Poisoning by other antidepressants, undetermined, initial encounter: Secondary | ICD-10-CM | POA: Insufficient documentation

## 2011-05-13 DIAGNOSIS — F1010 Alcohol abuse, uncomplicated: Secondary | ICD-10-CM | POA: Insufficient documentation

## 2011-05-13 DIAGNOSIS — F39 Unspecified mood [affective] disorder: Secondary | ICD-10-CM | POA: Insufficient documentation

## 2011-05-13 DIAGNOSIS — F17200 Nicotine dependence, unspecified, uncomplicated: Secondary | ICD-10-CM | POA: Insufficient documentation

## 2011-05-13 LAB — COMPREHENSIVE METABOLIC PANEL
ALT: 45 U/L — ABNORMAL HIGH (ref 0–35)
AST: 37 U/L (ref 0–37)
Albumin: 4 g/dL (ref 3.5–5.2)
Calcium: 9.5 mg/dL (ref 8.4–10.5)
Creatinine, Ser: 0.71 mg/dL (ref 0.50–1.10)
GFR calc non Af Amer: >90 mL/min (ref >90)
Sodium: 142 mEq/L (ref 135–145)
Total Protein: 7.2 g/dL (ref 6.0–8.3)

## 2011-05-13 LAB — RAPID URINE DRUG SCREEN, HOSP PERFORMED
Amphetamines: NOT DETECTED
Barbiturates: NOT DETECTED
Benzodiazepines: NOT DETECTED
Cocaine: NOT DETECTED
Tetrahydrocannabinol: NOT DETECTED

## 2011-05-13 LAB — ETHANOL: Alcohol, Ethyl (B): 134 mg/dL — ABNORMAL HIGH (ref 0–11)

## 2011-05-13 LAB — BLOOD GAS, ARTERIAL
Acid-base deficit: 6.5 mmol/L — ABNORMAL HIGH (ref 0.0–2.0)
Bicarbonate: 21.6 mEq/L (ref 20.0–24.0)
O2 Saturation: 98.4 %
TCO2: 16.9 mmol/L (ref 0–100)
TCO2: 19.7 mmol/L (ref 0–100)
pCO2 arterial: 34.9 mmHg — ABNORMAL LOW (ref 35.0–45.0)
pCO2 arterial: 37.7 mmHg (ref 35.0–45.0)
pH, Arterial: 7.376 (ref 7.350–7.400)
pO2, Arterial: 120 mmHg — ABNORMAL HIGH (ref 80.0–100.0)
pO2, Arterial: 92.1 mmHg (ref 80.0–100.0)

## 2011-05-13 LAB — POCT PREGNANCY, URINE: Preg Test, Ur: NEGATIVE

## 2011-05-13 LAB — GLUCOSE, CAPILLARY: Glucose-Capillary: 123 mg/dL — ABNORMAL HIGH (ref 70–99)

## 2011-05-13 LAB — ACETAMINOPHEN LEVEL: Acetaminophen (Tylenol), Serum: <15 ug/mL (ref 10–30)

## 2011-05-13 LAB — CBC
MCH: 32.6 pg (ref 26.0–34.0)
MCHC: 33.7 g/dL (ref 30.0–36.0)
MCV: 96.8 fL (ref 78.0–100.0)
Platelets: 194 10*3/uL (ref 150–400)
RBC: 4.35 MIL/uL (ref 3.87–5.11)

## 2011-05-13 MED ORDER — NALOXONE HCL 0.4 MG/ML IJ SOLN
0.4000 mg | Freq: Once | INTRAMUSCULAR | Status: AC
  Administered 2011-05-13: 0.4 mg via INTRAVENOUS
  Filled 2011-05-13: qty 1

## 2011-05-13 MED ORDER — NICOTINE 21 MG/24HR TD PT24
21.0000 mg | MEDICATED_PATCH | Freq: Once | TRANSDERMAL | Status: DC
  Administered 2011-05-13: 21 mg via TRANSDERMAL

## 2011-05-13 MED ORDER — NICOTINE 21 MG/24HR TD PT24
MEDICATED_PATCH | TRANSDERMAL | Status: AC
  Filled 2011-05-13: qty 1

## 2011-05-13 MED ORDER — SODIUM CHLORIDE 0.9 % IV BOLUS (SEPSIS)
1000.0000 mL | Freq: Once | INTRAVENOUS | Status: AC
  Administered 2011-05-13: 1000 mL via INTRAVENOUS

## 2011-05-13 MED ORDER — SODIUM CHLORIDE 0.9 % IV SOLN
1000.0000 mL | Freq: Once | INTRAVENOUS | Status: AC
  Administered 2011-05-13: 1000 mL via INTRAVENOUS

## 2011-05-13 NOTE — ED Notes (Signed)
Natalia Leatherwood from act team in to evaluate patient

## 2011-05-13 NOTE — ED Notes (Signed)
Waiting on act team to come in to assess patient

## 2011-05-13 NOTE — BH Assessment (Signed)
Assessment Note   Alison Kennedy is an 46 y.o. female. Pt presents with increase depression & suicide attempt via overdose. Pt admits taking an unk amt of trazadone with the intent of killing herself. Pt admits also consuming a 5th of liquor which she does frequently. Pt says she is still grieving over her deceased son who committed suicide. Pt expressed that she has a lot on her mind which pushed her over the edge. Pt admits to a long hx of depression & Etoh use but is not currently seeing a provider. Pt denies HI or AV. Per mom, pt had called her requesting the need for help in an emotional state. Pt expressed to mom that she no longer wanted to Live. Pt is not able to contract for safety. Once pt is medically cleared who will be reviewed at [facility].   Axis I: Mood Disorder NOS Axis II: Deferred Axis III:  Past Medical History  Diagnosis Date  . Depression   . Alcohol abuse   . Heart murmur    Axis IV: other psychosocial or environmental problems, problems related to social environment and Grief Axis V: 11-20 some danger of hurting self or others possible OR occasionally fails to maintain minimal personal hygiene OR gross impairment in communication  Past Medical History:  Past Medical History  Diagnosis Date  . Depression   . Alcohol abuse   . Heart murmur     Past Surgical History  Procedure Date  . 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 Asthma Son   . Asthma Brother   . Heart disease Maternal Grandfather   . COPD Maternal Grandfather     Social History:  reports that she has been smoking Cigarettes.  She has a 60 pack-year smoking history. She does not have any smokeless tobacco history on file. She reports that she drinks alcohol. She reports that she does not use illicit drugs.  Additional Social History:  Alcohol / Drug Use History of alcohol / drug  use?: Yes Substance #1 Name of Substance 1: Etoh (liquor) 1 - Age of First Use: 20 1 - Amount (size/oz): 5th 1 - Frequency: varies 1 - Duration: varies 1 - Last Use / Amount: 05/12/11 Allergies: No Known Allergies  Home Medications:  (Not in a hospital admission)  OB/GYN Status:  No LMP recorded. Patient is postmenopausal.  General Assessment Data Location of Assessment: [facility] ACT Assessment: Yes Living Arrangements: Other relatives Can pt return to current living arrangement?: Yes Admission Status: Voluntary Is patient capable of signing voluntary admission?: Yes Transfer from: Acute Hospital Referral Source: MD     Risk to self Suicidal Ideation: Yes-Currently Present Suicidal Intent: Yes-Currently Present Is patient at risk for suicide?: Yes Suicidal Plan?: Yes-Currently Present Specify Current Suicidal Plan: overdose on unk amt of tradazone & consumed etoh Access to Means: Yes Specify Access to Suicidal Means: pill What has been your use of drugs/alcohol within the last 12 months?: pt admits drinking since the age of 16 & normally drinks a 5th of liquor  when she can afford it. Previous Attempts/Gestures: Yes How many times?: 2  Other Self Harm Risks: na Triggers for Past Attempts: Unpredictable Intentional Self Injurious Behavior: None Family Suicide History: Yes Recent stressful life event(s): Loss (Comment);Other (Comment) (son commited suicide 2012 which pt is still grieving over) Persecutory voices/beliefs?: No  Depression: Yes Depression Symptoms: Loss of interest in usual pleasures;Feeling angry/irritable;Feeling worthless/self pity;Isolating Substance abuse history and/or treatment for substance abuse?: Yes Suicide prevention information given to non-admitted patients: Not applicable  Risk to Others Homicidal Ideation: No Thoughts of Harm to Others: No Current Homicidal Intent: No Current Homicidal Plan: No Access to Homicidal Means: No Identified  Victim: na History of harm to others?: No Assessment of Violence: None Noted Violent Behavior Description: calm, cooperative, depressed Does patient have access to weapons?: No Criminal Charges Pending?: No Does patient have a court date: No  Psychosis Hallucinations: None noted Delusions: None noted  Mental Status Report Appear/Hygiene: Disheveled Eye Contact: Poor Motor Activity: Freedom of movement Speech: Logical/coherent Level of Consciousness: Alert Mood: Depressed;Anhedonia;Helpless;Sad Affect: Appropriate to circumstance;Depressed Anxiety Level: None Thought Processes: Coherent;Relevant Judgement: Impaired Orientation: Person;Place;Time;Situation Obsessive Compulsive Thoughts/Behaviors: None  Cognitive Functioning Concentration: Decreased Memory: Recent Intact;Remote Intact IQ: Average Insight: Poor Impulse Control: Poor Appetite: Good Weight Loss: 0  Weight Gain: 0  Sleep: Increased Total Hours of Sleep: 15  Vegetative Symptoms: None  Prior Inpatient Therapy Prior Inpatient Therapy: Yes Prior Therapy Dates: unk Prior Therapy Facilty/Provider(s): [facility] Reason for Treatment: stabilization  Prior Outpatient Therapy Prior Outpatient Therapy: Yes Prior Therapy Dates: na Prior Therapy Facilty/Provider(s): na Reason for Treatment: na            Values / Beliefs Cultural Requests During Hospitalization: None Spiritual Requests During Hospitalization: None        Additional Information 1:1 In Past 12 Months?: No CIRT Risk: No Elopement Risk: No Does patient have medical clearance?: No     Disposition:  Disposition Disposition of Patient: Inpatient treatment program;Referred to ([facility] once medically cleared) Type of inpatient treatment program: Adult  On Site Evaluation by:   Reviewed with Physician:     Waldron Session 05/13/2011 10:47 PM

## 2011-05-13 NOTE — ED Notes (Signed)
Per family, pt has made several attempts in the past to commit suicide.  Per family, pt called her today and told her that she wanted to kill herself.  Mom reports that pt told her that she took several trazadone and has been drinking etoh.  Mom reports that pt's son committed suicide last year, and pt has been having a hard time dealing with it.  Pt is requesting help.

## 2011-05-13 NOTE — ED Provider Notes (Signed)
History     CSN: [id number]  Arrival date & time 05/13/11  1850   First MD Initiated Contact with Patient 05/13/11 1857      Chief Complaint  Patient presents with  . Suicidal     Patient is a 46 y.o. female presenting with Overdose. The history is provided by a parent. The history is limited by the condition of the patient.  Drug Overdose This is a new problem. Episode onset: an unknown time ago. The problem occurs constantly. The problem has been rapidly worsening. The symptoms are aggravated by nothing. The symptoms are relieved by nothing. She has tried nothing for the symptoms. The treatment provided no relief.  Pt presents with mother apparent drug overdose Mother reports that she was called by patient at approximately 1730 "asking for help" and wanting to "be committed" Mother reports she has h/o ETOH abuse, h/o drug abuse and h/o depression in the past She reports that she may have taken trazodone and ETOH at home but is unsure of any other medications/drugs that were ingested. No other details are known  Past Medical History  Diagnosis Date  . Depression   . Alcohol abuse   . Heart murmur     Past Surgical History  Procedure Date  . 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 Asthma Son   . Asthma Brother   . Heart disease Maternal Grandfather   . COPD Maternal Grandfather     History  Substance Use Topics  . Smoking status: Current Everyday Smoker -- 2.0 packs/day for 30 years    Types: Cigarettes  . Smokeless tobacco: Not on file  . Alcohol Use: Yes     fomer heavy drinker    OB History    Grav Para Term Preterm Abortions TAB SAB Ect Mult Living                  Review of Systems  Unable to perform ROS: Mental status change    Allergies  Review of patient's allergies indicates no known allergies.  Home Medications  No current outpatient prescriptions on file.  BP  98/49  Pulse 81  Temp(Src) 98.6 F (37 C) (Oral)  Resp 12  SpO2 98% BP 98/61  Pulse 88  Temp(Src) 98.7 F (37.1 C) (Rectal)  Resp 20  SpO2 97%   Physical Exam CONSTITUTIONAL: Well developed/well nourished HEAD AND FACE: Normocephalic/atraumatic, no signs of trauma EYES: PERRL pupils are not pinpoint ENMT: Mucous membranes dry NECK: supple no meningeal signs CV: S1/S2 noted, no murmurs/rubs/gallops noted LUNGS: Lungs are clear to auscultation bilaterally, no apparent distress ABDOMEN: soft, nontender, no rebound or guarding, +BS WU:[id number] external genitalia, chaperone present NEURO: Pt is somnolent but responsive to pain and mumbles incoherently.  She moves all extremities equally EXTREMITIES: pulses normal, full ROM, no deformity SKIN: warm, color normal PSYCH: somnolent  ED Course  Procedures  CRITICAL CARE Performed by: Joya Gaskins   Total critical care time: 35  Critical care time was exclusive of separately billable procedures and treating other patients.  Critical care was necessary to treat or prevent imminent or life-threatening deterioration.  Critical care was time spent personally by me on the following activities: development of treatment plan with patient and/or surrogate as well as nursing, discussions with consultants, evaluation of patient's response to treatment, examination of patient, obtaining history from patient or surrogate, ordering and performing  treatments and interventions, ordering and review of laboratory studies, ordering and review of radiographic studies, pulse oximetry and re-evaluation of patient's condition.   Labs Reviewed  GLUCOSE, CAPILLARY - Abnormal; Notable for the following:    Glucose-Capillary 123 (*)    All other components within normal limits  ACETAMINOPHEN LEVEL  COMPREHENSIVE METABOLIC PANEL  URINE RAPID DRUG SCREEN (HOSP PERFORMED)  ETHANOL  SALICYLATE LEVEL  CBC  BLOOD GAS, ARTERIAL  7:40 PM Pt seen on  arrival for altered mental status and presumed drug overdose Initially hypotensive but this has responded to IV fluids Given one dose of narcan and she started to wake up and reports she wanted to harm herself Will follow closely 8:01 PM Pt more awake, but mildly hypotensive Will continue to monitor 9:47 PM Pt awake/alert Initial workup negative She reports she took ETOH and "a few trazodone" and denies any other ingestions She has remained stable in the ED Will obtain psych consult at this time with ACT Will obtain 4 hr APAP level to ensure no change and not an occult APAP overdose D/w dr Mellody Life to f/u on repeat [facility]AP level and ACT evaluation  MDM  Nursing notes reviewed and considered in documentation Previous records reviewed and considered All labs/vitals reviewed and considered        Date: 05/13/2011  Rate: 71  Rhythm: normal sinus rhythm  QRS Axis: normal  Intervals: normal  ST/T Wave abnormalities: inverted t waves  Conduction Disutrbances:none  Narrative Interpretation:   Old EKG Reviewed: changes noted QT no longer prolonged when compared to prior   Joya Gaskins, MD 05/13/11 2151

## 2011-05-14 ENCOUNTER — Ambulatory Visit (HOSPITAL_COMMUNITY): Admission: EM | Admit: 2011-05-14 | Payer: PRIVATE HEALTH INSURANCE | Source: Ambulatory Visit | Admitting: Psychiatry

## 2011-05-14 ENCOUNTER — Inpatient Hospital Stay (HOSPITAL_COMMUNITY)
Admission: EM | Admit: 2011-05-14 | Discharge: 2011-05-18 | DRG: 897 | Disposition: A | Discharge status: Home / Self Care | Payer: 59 | Source: Ambulatory Visit | Attending: Psychiatry | Admitting: Psychiatry

## 2011-05-14 DIAGNOSIS — F1020 Alcohol dependence, uncomplicated: Secondary | ICD-10-CM | POA: Diagnosis present

## 2011-05-14 DIAGNOSIS — F10988 Alcohol use, unspecified with other alcohol-induced disorder: Principal | ICD-10-CM | POA: Diagnosis present

## 2011-05-14 DIAGNOSIS — F329 Major depressive disorder, single episode, unspecified: Secondary | ICD-10-CM | POA: Diagnosis present

## 2011-05-14 DIAGNOSIS — F10929 Alcohol use, unspecified with intoxication, unspecified: Secondary | ICD-10-CM

## 2011-05-14 DIAGNOSIS — T5191XD Toxic effect of unspecified alcohol, accidental (unintentional), subsequent encounter: Secondary | ICD-10-CM

## 2011-05-14 DIAGNOSIS — F332 Major depressive disorder, recurrent severe without psychotic features: Secondary | ICD-10-CM | POA: Diagnosis present

## 2011-05-14 DIAGNOSIS — R45851 Suicidal ideations: Secondary | ICD-10-CM

## 2011-05-14 DIAGNOSIS — F17200 Nicotine dependence, unspecified, uncomplicated: Secondary | ICD-10-CM | POA: Diagnosis present

## 2011-05-14 DIAGNOSIS — Z634 Disappearance and death of family member: Secondary | ICD-10-CM

## 2011-05-14 MED ORDER — MAGNESIUM HYDROXIDE 400 MG/5ML PO SUSP: 30.0000 mL | Freq: Every day | ORAL | Status: DC | PRN

## 2011-05-14 MED ORDER — THIAMINE HCL 100 MG/ML IJ SOLN: 100.0000 mg | Freq: Once | INTRAMUSCULAR | Status: DC

## 2011-05-14 MED ORDER — HYDROXYZINE HCL 25 MG PO TABS: 25.0000 mg | ORAL_TABLET | Freq: Four times a day (QID) | ORAL | Status: AC | PRN

## 2011-05-14 MED ORDER — CHLORDIAZEPOXIDE HCL 25 MG PO CAPS: 25.0000 mg | ORAL_CAPSULE | Freq: Four times a day (QID) | ORAL | Status: AC | PRN

## 2011-05-14 MED ORDER — ADULT MULTIVITAMIN W/MINERALS CH
1.0000 | ORAL_TABLET | Freq: Every day | ORAL | Status: DC
  Administered 2011-05-15 – 2011-05-18 (×4): 1 via ORAL
  Filled 2011-05-14 (×5): qty 1

## 2011-05-14 MED ORDER — VITAMIN B-1 100 MG PO TABS
100.0000 mg | ORAL_TABLET | Freq: Every day | ORAL | Status: DC
Start: 2011-05-15 — End: 2011-05-18
  Administered 2011-05-15 – 2011-05-18 (×4): 100 mg via ORAL
  Filled 2011-05-14 (×5): qty 1

## 2011-05-14 MED ORDER — HYDROXYZINE HCL 50 MG PO TABS: 50.0000 mg | ORAL_TABLET | Freq: Once | ORAL | Status: AC | PRN

## 2011-05-14 MED ORDER — ACETAMINOPHEN 325 MG PO TABS: 650.0000 mg | ORAL_TABLET | Freq: Four times a day (QID) | ORAL | Status: DC | PRN

## 2011-05-14 MED ORDER — LOPERAMIDE HCL 2 MG PO CAPS: 2.0000 mg | ORAL_CAPSULE | ORAL | Status: AC | PRN

## 2011-05-14 MED ORDER — ALUM & MAG HYDROXIDE-SIMETH 200-200-20 MG/5ML PO SUSP: 30.0000 mL | ORAL | Status: DC | PRN

## 2011-05-14 MED ORDER — NICOTINE 21 MG/24HR TD PT24
21.0000 mg | MEDICATED_PATCH | Freq: Every day | TRANSDERMAL | Status: DC
  Administered 2011-05-14 – 2011-05-18 (×4): 21 mg via TRANSDERMAL
  Filled 2011-05-14 (×6): qty 1

## 2011-05-14 MED ORDER — OXYCODONE-ACETAMINOPHEN 5-325 MG PO TABS
2.0000 | ORAL_TABLET | Freq: Once | ORAL | Status: AC
Start: 2011-05-14 — End: 2011-05-14
  Administered 2011-05-14: 2 via ORAL
  Filled 2011-05-14: qty 2

## 2011-05-14 MED ORDER — ONDANSETRON 4 MG PO TBDP: 4.0000 mg | ORAL_TABLET | Freq: Four times a day (QID) | ORAL | Status: AC | PRN

## 2011-05-14 NOTE — Tx Team (Signed)
Initial Interdisciplinary Treatment Plan  PATIENT STRENGTHS: (choose at least two) Ability for insight Capable of independent living General fund of knowledge  PATIENT STRESSORS: Suicide of son in September 2012 Unemployed  PROBLEM LIST: Problem List/Patient Goals Date to be addressed Date deferred Reason deferred Estimated date of resolution  Depression with SA      05/14/11                                                      DISCHARGE CRITERIA:  Ability to meet basic life and health needs Adequate post-discharge living arrangements Improved stabilization in mood, thinking, and/or behavior Medical problems require only outpatient monitoring Motivation to continue treatment in a less acute level of care Need for constant or close observation no longer present Reduction of life-threatening or endangering symptoms to within safe limits Verbal commitment to aftercare and medication compliance  PRELIMINARY DISCHARGE PLAN: Return to previous living arrangement  PATIENT/FAMIILY INVOLVEMENT: This treatment plan has been presented to and reviewed with the patient, Alison Kennedy, and/or family member.  The patient and family have been given the opportunity to ask questions and make suggestions.  [name] ANN 05/14/2011, 7:53 PM

## 2011-05-14 NOTE — ED Notes (Signed)
Patient provided [employer] per request. Denies any other needs at this time.

## 2011-05-14 NOTE — ED Notes (Signed)
Patient's mother called to check up on patient. Patient willing to speak with mother. Phone given to patient. Speaking with mother currently.

## 2011-05-14 NOTE — ED Notes (Signed)
Report called to [facility] to [place], [place]. [facility] called for transport. Awaiting transport.

## 2011-05-14 NOTE — Progress Notes (Signed)
Patient ID: Alison Kennedy, female   DOB: [birth year], 46 y.o.   MRN: [id number] Patient admitted after suicide attempt via ETOH and trazodone. Reports feeling suicidal is a constant problem for her. Describes feeling depressed since teenage years but recently made worse by suicide of son in September 2012. Patient is currently not working and described "staying in the bed a lot and just can't get moving." Able to contract for her safety. Is interested in grief counseling for her loss. Patient identifies having three other children who are living. Appears very sad and depressed. Oriented to the 500 hall unit and routine.

## 2011-05-14 NOTE — BH Assessment (Signed)
Assessment Note   Patient accepted to [facility] by Verne Spurr PA to Dr. Dan Humphreys room 502.2.  Rudi Coco 05/14/2011 2:48 PM

## 2011-05-14 NOTE — BH Assessment (Signed)
Assessment Note   No indigent beds at [facility] [place].  Rudi Coco 05/14/2011 11:23 AM

## 2011-05-14 NOTE — ED Notes (Signed)
ACT team member at bedside 

## 2011-05-14 NOTE — ED Notes (Signed)
Report given to [facility] RN. [facility] ETA 30 minutes. Patient informed.

## 2011-05-14 NOTE — ED Notes (Signed)
Patient denies pain and is  amulating well to rest room with sitter at side

## 2011-05-14 NOTE — ED Notes (Signed)
Patient eating breakfast. NAD noted.

## 2011-05-14 NOTE — Progress Notes (Signed)
0020 Assumed care/disposition of patient. Patient with suicidal ideation. She has been seen and evaluated by ACT. Currently awaiting repeat ABG and tylenol level for medical clearance.  0200 ABG repeat and tylenol level have been reviewed. Patient is medically clear for psychiatric placement.   Results for orders placed during the hospital encounter of 05/13/11  ACETAMINOPHEN LEVEL      Component Value Range   Acetaminophen (Tylenol), Serum <15.0  10 - 30 (ug/mL)  COMPREHENSIVE METABOLIC PANEL      Component Value Range   Sodium 142  135 - 145 (mEq/L)   Potassium 4.1  3.5 - 5.1 (mEq/L)   Chloride 104  96 - 112 (mEq/L)   CO2 20  19 - 32 (mEq/L)   Glucose, Bld 125 (*) 70 - 99 (mg/dL)   BUN 9  6 - 23 (mg/dL)   Creatinine, Ser 4.09  0.50 - 1.10 (mg/dL)   Calcium 9.5  8.4 - 81.1 (mg/dL)   Total Protein 7.2  6.0 - 8.3 (g/dL)   Albumin 4.0  3.5 - 5.2 (g/dL)   AST 37  0 - 37 (U/L)   ALT 45 (*) 0 - 35 (U/L)   Alkaline Phosphatase 89  39 - 117 (U/L)   Total Bilirubin 0.2 (*) 0.3 - 1.2 (mg/dL)   GFR calc non Af Amer >90  >90 (mL/min)   GFR calc Af Amer >90  >90 (mL/min)  URINE RAPID DRUG SCREEN (HOSP PERFORMED)      Component Value Range   Opiates NONE DETECTED  NONE DETECTED    Cocaine NONE DETECTED  NONE DETECTED    Benzodiazepines NONE DETECTED  NONE DETECTED    Amphetamines NONE DETECTED  NONE DETECTED    Tetrahydrocannabinol NONE DETECTED  NONE DETECTED    Barbiturates NONE DETECTED  NONE DETECTED   ETHANOL      Component Value Range   Alcohol, Ethyl (B) 134 (*) 0 - 11 (mg/dL)  SALICYLATE LEVEL      Component Value Range   Salicylate Lvl <2.0 (*) 2.8 - 20.0 (mg/dL)  CBC      Component Value Range   WBC 8.3  4.0 - 10.5 (K/uL)   RBC 4.35  3.87 - 5.11 (MIL/uL)   Hemoglobin 14.2  12.0 - 15.0 (g/dL)   HCT 91.4  78.2 - 95.6 (%)   MCV 96.8  78.0 - 100.0 (fL)   MCH 32.6  26.0 - 34.0 (pg)   MCHC 33.7  30.0 - 36.0 (g/dL)   RDW 21.3  08.6 - 57.8 (%)   Platelets 194  150 - 400 (K/uL)    BLOOD GAS, ARTERIAL      Component Value Range   O2 Content 4.0     Delivery systems NASAL CANNULA     pH, Arterial 7.337 (*) 7.350 - 7.400    pCO2 arterial 34.9 (*) 35.0 - 45.0 (mmHg)   pO2, Arterial 120.0 (*) 80.0 - 100.0 (mmHg)   Bicarbonate 18.2 (*) 20.0 - 24.0 (mEq/L)   TCO2 16.9  0 - 100 (mmol/L)   Acid-base deficit 6.5 (*) 0.0 - 2.0 (mmol/L)   O2 Saturation 98.4     Patient temperature 37.0     Collection site LEFT RADIAL     Drawn by [id number]     Sample type ARTERIAL     Allens test (pass/fail) PASS  PASS   GLUCOSE, CAPILLARY      Component Value Range   Glucose-Capillary 123 (*) 70 - 99 (mg/dL)  POCT PREGNANCY, URINE  Component Value Range   Preg Test, Ur NEGATIVE  NEGATIVE   ACETAMINOPHEN LEVEL      Component Value Range   Acetaminophen (Tylenol), Serum <15.0  10 - 30 (ug/mL)  BLOOD GAS, ARTERIAL      Component Value Range   O2 Content 3.0     Delivery systems NASAL CANNULA     pH, Arterial 7.376  7.350 - 7.400    pCO2 arterial 37.7  35.0 - 45.0 (mmHg)   pO2, Arterial 92.1  80.0 - 100.0 (mmHg)   Bicarbonate 21.6  20.0 - 24.0 (mEq/L)   TCO2 19.7  0 - 100 (mmol/L)   Acid-base deficit 2.8 (*) 0.0 - 2.0 (mmol/L)   O2 Saturation 97.3     Patient temperature 37.0     Collection site LEFT RADIAL     Drawn by [id number]     Sample type ARTERIAL     Allens test (pass/fail) PASS  PASS

## 2011-05-14 NOTE — ED Notes (Signed)
Patient provided hot lunch tray.
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
## 2011-05-14 NOTE — ED Notes (Signed)
Patient's mother at bedside.

## 2011-05-14 NOTE — ED Notes (Signed)
Patient c/o right sided chest pain. Dr. Brooke Dare made aware and in to assess patient. Vital signs obtained and are stable, see doc flowsheets for vital signs.

## 2011-05-14 NOTE — ED Notes (Signed)
Patient ambulatory to restroom with steady gait. Sitter accompanied patient to restroom.

## 2011-05-15 DIAGNOSIS — Z634 Disappearance and death of family member: Secondary | ICD-10-CM

## 2011-05-15 DIAGNOSIS — F1010 Alcohol abuse, uncomplicated: Secondary | ICD-10-CM

## 2011-05-15 DIAGNOSIS — F10988 Alcohol use, unspecified with other alcohol-induced disorder: Principal | ICD-10-CM

## 2011-05-15 DIAGNOSIS — T5191XD Toxic effect of unspecified alcohol, accidental (unintentional), subsequent encounter: Secondary | ICD-10-CM | POA: Diagnosis present

## 2011-05-15 MED ORDER — CITALOPRAM HYDROBROMIDE 20 MG PO TABS
20.0000 mg | ORAL_TABLET | Freq: Every day | ORAL | Status: DC
  Administered 2011-05-15 – 2011-05-18 (×4): 20 mg via ORAL
  Filled 2011-05-15 (×6): qty 1

## 2011-05-15 NOTE — BHH Suicide Risk Assessment (Signed)
Suicide Risk Assessment  Admission Assessment     Demographic factors:  Assessment Details Time of Assessment: Admission Information Obtained From: Patient Current Mental Status:  See below Loss Factors:  Loss Factors: Decrease in vocational status;Loss of significant relationship;Financial problems / change in socioeconomic status, loss of son (he committed SI last yeast) Historical Factors:  Historical Factors: Prior suicide attempts;Impulsivity Risk Reduction Factors:  Risk Reduction Factors: Responsible for children under 58 years of age;Sense of responsibility to family;Religious beliefs about death;Living with another person, especially a relative  CLINICAL FACTORS:   Depression:   Hopelessness, alcohol abuse  COGNITIVE FEATURES THAT CONTRIBUTE TO RISK:  Closed-mindedness    SUICIDE RISK:   Moderate:  Frequent suicidal ideation with limited intensity, and duration, some specificity in terms of plans, no associated intent, good self-control, limited dysphoria/symptomatology, some risk factors present, and identifiable protective factors, including available and accessible social support.  PLAN OF CARE:   Mental Status Examination/Evaluation:  Objective: Appearance: Fairly Groomed   Psychomotor Activity: Normal   Eye Contact:: Good   Speech: Normal Rate   Volume: Normal   Mood: Depressed   Affect: Depressed   Thought Process: Clear rational goal orieneted - get meds   Orientation: Full   Thought Content: denies AVH/Psychosis   Suicidal Thoughts: No   Homicidal Thoughts: No   Judgement: Impaired   Insight: Shallow   DIAGNOSIS:  AXIS I  Alcohol  Abuse, r/o Substance Induced Mood Disorder, non comliance with meds  AXIS II  Cluster B Traits   AXIS III  See medical history.   AXIS IV  economic problems, occupational problems, other psychosocial or environmental problems, problems related to social environment and problems with primary support group   AXIS V  11-20 some  danger of hurting self or others possible OR occasionally fails to maintain minimal personal hygiene OR gross impairment in communication   Treatment Plan Summary:  Admit for safety & stabilization  Medically support through alcohol detox with Librium  Start Celexa for depression    Alison Kennedy 05/15/2011, 8:35 PM

## 2011-05-15 NOTE — Progress Notes (Signed)
Patient ID: Alison Kennedy, female   DOB: Sep 19, 1965, 46 y.o.   MRN: [id number] Pt. Reports depression at "7" of 10, currently denies SHI. Pt. Reports groups helpful point "that I'm not worthless."  Pt. Preparing for group. Staff will monitor q57min for safety.

## 2011-05-15 NOTE — H&P (Signed)
  Psychiatric Admission Assessment Adult  Patient Identification:  Alison Kennedy Date of Evaluation:  05/15/2011 46yo sep WF CC: Suicidal overdosed and drunk ETOH 134 History of Present Illness: Called her mother and asked to be committed to get help. Also let her mother know that she had taken trazadone in a suicide attempt. Hasn't been here for 2 years -but had a similar presentation then. Says that she has no outside care as she is not employed. Reports having been depressed since she was a teen and has abused alcohol to "self medicate"    Past Psychiatric History: Here May 29th 20111 DWI August 2010 Has also been to [facility] and [facility]   Substance Abuse History:  Social History:    reports that she has been smoking Cigarettes.  She has a 60 pack-year smoking history. She does not have any smokeless tobacco history on file. She reports that she drinks alcohol. She reports that she does not use illicit drugs. First marriage [phone] Second 2 years  Third 2002 - now separated   Family Psych History: 66 yo son suicided last year. Has 2 daughters 80 & 36 and a 9yo son who lives with his father.  Past Medical History:     Past Medical History  Diagnosis Date  . Depression   . Alcohol abuse   . Heart murmur        Past Surgical History  Procedure Date  . 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    Allergies: No Known Allergies  Current Medications:  Prior to Admission medications   Not on File    Mental Status Examination/Evaluation: Objective:  Appearance: Fairly Groomed  Psychomotor Activity:  Normal  Eye Contact::  Good  Speech:  Normal Rate  Volume:  Normal  Mood: Depressed  Affect:  Depressed  Thought Process:  Clear rational goal orieneted - get meds   Orientation:  Full  Thought Content:  denies AVH/Psychosis   Suicidal Thoughts: No    Homicidal Thoughts:  No  Judgement:  Impaired  Insight:  Shallow     DIAGNOSIS:    AXIS I Substance Abuse and Substance Induced Mood Disorder  AXIS II Cluster B Traits  AXIS III See medical history.  AXIS IV economic problems, occupational problems, other psychosocial or environmental problems, problems related to social environment and problems with primary support group  AXIS V 11-20 some danger of hurting self or others possible OR occasionally fails to maintain minimal personal hygiene OR gross impairment in communication     Treatment Plan Summary: Admit for safety & stabilization Medically support through alcohol detox with Librium Start Celexa for depression  Agree with H&P from ED
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
## 2011-05-15 NOTE — Progress Notes (Signed)
05/14/11  D Pt is seen OOB UAL on the 500 hall tolerated fair. SHe is anxious, frequently tearful and seen crying and pacing out in the hall. She is cooperative.Marland KitchenMarland KitchenCompliant with her POC and attends her groups as planned. She is very sad, shies away from eye contact and shakes her head from side to side when this nurse asks her about why she is crying. A SHe completed her self inventory and on it she wrote she cont to have " off and on" SI ( she contracted with this nurse for safety), she rated her depression and hopelessness " 8 / 10 " and states " I'm not sure yet" when asked about her DC plans. R Therapeutic relationship is initiated and supported today. Pt met with PA, med orders received  And POC cont PD RN [facility]

## 2011-05-15 NOTE — H&P (Signed)
  Pt was seen by me today and I agree with the key elements documented in H&P.  

## 2011-05-15 NOTE — Progress Notes (Signed)
Report received from Yevonne Aline RN. Writer observed patient lying in bed asleep with eyes closed, resp. even  and unlabored no signs of distress noted. Safety maintained will continue to monitor.

## 2011-05-15 NOTE — Progress Notes (Signed)
[facility] Adult Inpatient Family/Significant Other Suicide Prevention Education  Suicide Prevention Education:  Education Completed; Lucendia Herrlich [phone]-  has been identified by the patient as the family member/significant other with whom the patient will be residing, and identified as the person(s) who will aid the patient in the event of a mental health crisis (suicidal ideations/suicide attempt).  With written consent from the patient, the family member/significant other has been provided the following suicide prevention education, prior to the and/or following the discharge of the patient.  The suicide prevention education provided includes the following:  Suicide risk factors  Suicide prevention and interventions  National Suicide Hotline telephone number  [facility] assessment telephone number  [facility] Emergency Assistance 911  [facility] and/or Residential Mobile Crisis Unit telephone number  Request made of family/significant other to:  Remove weapons (e.g., guns, rifles, knives), all items previously/currently identified as safety concern.Pt.'s mother be lives that there are no gus.    Remove drugs/medications (over-the-counter, prescriptions, illicit drugs), all items previously/currently identified as a safety concern.  Pt.'s mother will check and secure home but is unsure if pt. Can return to live with her grandmother.  Pt.'s mother went into great detail about pt.'s mental Health issues. Pt.'s mother states that the loss of the pt.'s son due to Suicide, was not the only factor in the pt.'s mental health issues. Pt.'s mother will talk and find out if the pt. Will be able to return to live with grand mother. Pt.'s mother states that the pt. Only call her when she wants or needs something. Pt.'s mother asked about d/c and what would happen to the pt. If she could not return to live with family. Pt.'s mother wants pt. To go into long term treatment for  alcohol/medication use. Pt. During the intake is blaming her issue son Suicide of son but mother says that things happened to the pt. Was left and divorced by her ex husband. Pt.'s mother wants Case manger and counselor at number to speak about d/c plans for the pt. Pt.'s mother would also like to speak to Dr. Dan Humphreys next week also.  The family member/significant other verbalizes understanding of the suicide prevention education information provided.  The family member/significant other agrees to remove the items of safety concern listed above.  Lamar Blinks [place] 05/15/2011, 6:19 PM

## 2011-05-16 DIAGNOSIS — F4321 Adjustment disorder with depressed mood: Secondary | ICD-10-CM

## 2011-05-16 DIAGNOSIS — F1020 Alcohol dependence, uncomplicated: Secondary | ICD-10-CM

## 2011-05-16 DIAGNOSIS — F332 Major depressive disorder, recurrent severe without psychotic features: Secondary | ICD-10-CM

## 2011-05-16 MED ORDER — ALBUTEROL SULFATE HFA 108 (90 BASE) MCG/ACT IN AERS
INHALATION_SPRAY | RESPIRATORY_TRACT | Status: AC
  Administered 2011-05-16: 2 via RESPIRATORY_TRACT
  Filled 2011-05-16: qty 6.7

## 2011-05-16 MED ORDER — ALBUTEROL SULFATE HFA 108 (90 BASE) MCG/ACT IN AERS
2.0000 | INHALATION_SPRAY | Freq: Four times a day (QID) | RESPIRATORY_TRACT | Status: DC | PRN
  Administered 2011-05-16: 2 via RESPIRATORY_TRACT

## 2011-05-16 NOTE — Progress Notes (Signed)
05/16/11  Alison Kennedy is seen OOB UAL on the hall this AM...she makes good, direct eye contact. SHe is interacting with her peers. She states she is " better" when this nurse asks her how she feels. A SHe takes her medications as ordered by the MD. She attends her groups and completed her self inventory . On it she wrote she denied SI, she rated her feelings of depression and hopelessness " 7 / 8 " and stated when she is DC'Alison, she plans to do more for herself. R Safety is maintained and POC includes continuing to foster theratpeutic relationship already established PD RN [facility]

## 2011-05-16 NOTE — Progress Notes (Signed)
  Alison Kennedy is a 46 y.o. female [id number] Jan 16, 1965  05/14/2011 Principal Problem:  *Alcohol causing toxic effect, subsequent encounter suicide attempt OD with trazadone  Active Problems:  Bereavement son suicided    Mental Status: Alert mood is brighter than yesterday. Denies feeling suicidal today. No AVH no alcohol withdrawal.   Subjective/Objective: Slept well doesn't feel suicidal today. Hopes to get therapy in [place]. Also has been encouraged to apply for Disability as she has no income.   Filed Vitals:   05/16/11 0758  BP: 125/82  Pulse: 69  Temp:   Resp:     Lab Results:   BMET    Component Value Date/Time   NA 142 05/13/2011 1926   K 4.1 05/13/2011 1926   CL 104 05/13/2011 1926   CO2 20 05/13/2011 1926   GLUCOSE 125* 05/13/2011 1926   BUN 9 05/13/2011 1926   CREATININE 0.71 05/13/2011 1926   CALCIUM 9.5 05/13/2011 1926   GFRNONAA >90 05/13/2011 1926   GFRAA >90 05/13/2011 1926    Medications:  Scheduled:     . citalopram  20 mg Oral Daily  . mulitivitamin with minerals  1 tablet Oral Daily  . nicotine  21 mg Transdermal Q0600  . thiamine  100 mg Intramuscular Once  . thiamine  100 mg Oral Daily     PRN Meds acetaminophen, alum & mag hydroxide-simeth, chlordiazePOXIDE, hydrOXYzine, loperamide, magnesium hydroxide, ondansetron  Plan: continue current plan of care.   Lossie Kalp,MICKIE D. 05/16/2011

## 2011-05-16 NOTE — Progress Notes (Signed)
Was sitting in the dayroom watching TV with peers on approach. Agreed to speak with Clinical research associate. Appears flat and depressed. Calm and cooperative with assessment. No acute distress noted. Stated she has had a good day. When asked to qualify a good day, states she had a good interaction with the RN from days. Stated she was very supportive and offered her some good insight. Stated the interaction helped to improve her move significantly.  Support and encouragement provided. Stated she also received a phone call from her mother and they had a good conversation. Rated her depression at 6 vs 10(10 being the most depressed) on admission. Currently denies SI/HI/AVH and contracted for safety. Denies pain or discomfort. POC and medications reviewed for the shift and understanding verbalized. Offered no questions or concerns. Safety has been maintained with Q15 minute observation. Will continue current POC.

## 2011-05-16 NOTE — Progress Notes (Signed)
[facility] Group Notes:  (Counselor/Nursing/MHT/Case Management/Adjunct)  05/16/2011 5:37 PM  Type of Therapy:  After Care Planning Group  Pt. participated in after care planning group and was given [place] SI Prevention information and crisis and hotline numbers. The patients agreed to use them if needed. The patients were also given information about the [facility] located in [place] and free support groups located in [place] also. Pt. 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 Yesterday. No medications were changed and the pt. States she slept well last night.  Neila Gear 05/16/2011, 5:37 PM

## 2011-05-16 NOTE — Progress Notes (Signed)
[facility] Group Notes:  (Counselor/Nursing/MHT/Case Management/Adjunct)  05/16/2011 6:16 PM  Type of Therapy:  Group Therapy  Participation Level:  Active  Participation Quality:  Appropriate and Attentive  Affect:  Appropriate  Cognitive:  Appropriate  Insight:  Good  Engagement in Group:  Good  Engagement in Therapy:  Good  Modes of Intervention:  Clarification, Limit-setting, Socialization and Support  Summary of Progress/Problems: Pt. participated in group on supports and the difference between health and unhealthy supports. Each pt. Shared who was a support in their life and how that person supports them. Pt.'s participated in the in activity on what it feels to feel supported.  Pt. Spoke about her mother , grand mother and her whole family being a support Pt. Rated her support system as a 10(very High).   Lamar Blinks [place] 05/16/2011, 6:16 PM

## 2011-05-17 DIAGNOSIS — F329 Major depressive disorder, single episode, unspecified: Secondary | ICD-10-CM | POA: Diagnosis present

## 2011-05-17 DIAGNOSIS — F17200 Nicotine dependence, unspecified, uncomplicated: Secondary | ICD-10-CM

## 2011-05-17 MED ORDER — BUPROPION HCL ER (SR) 100 MG PO TB12: 100.0000 mg | ORAL_TABLET | Freq: Two times a day (BID) | ORAL | Status: DC

## 2011-05-17 MED ORDER — CITALOPRAM HYDROBROMIDE 20 MG PO TABS: 20.0000 mg | ORAL_TABLET | Freq: Every day | ORAL | Status: DC

## 2011-05-17 MED ORDER — ADULT MULTIVITAMIN W/MINERALS CH: 1.0000 | ORAL_TABLET | Freq: Every day | ORAL | Status: DC

## 2011-05-17 MED ORDER — BUPROPION HCL ER (SR) 100 MG PO TB12
100.0000 mg | ORAL_TABLET | Freq: Two times a day (BID) | ORAL | Status: DC
  Administered 2011-05-17 – 2011-05-18 (×2): 100 mg via ORAL
  Filled 2011-05-17 (×4): qty 1

## 2011-05-17 MED ORDER — THIAMINE HCL 100 MG PO TABS: 100.0000 mg | ORAL_TABLET | Freq: Every day | ORAL | Status: AC

## 2011-05-17 MED ORDER — NICOTINE 21 MG/24HR TD PT24: 1.0000 | MEDICATED_PATCH | Freq: Every day | TRANSDERMAL | Status: AC

## 2011-05-17 MED ORDER — ALBUTEROL SULFATE HFA 108 (90 BASE) MCG/ACT IN AERS: 2.0000 | INHALATION_SPRAY | Freq: Four times a day (QID) | RESPIRATORY_TRACT | Status: DC | PRN

## 2011-05-17 NOTE — Progress Notes (Signed)
Patient ID: Alison Kennedy, female   DOB: [birth year], 46 y.o.   MRN: [id number] Pt reports she slept fair and her appetite is good.  She rated he depression a 6 and hopelessness an 8.  She says she plans to take better care of herself.  She requested information on celexa and was given printout.

## 2011-05-17 NOTE — Tx Team (Signed)
Interdisciplinary Treatment Plan Update (Adult)  Date:  05/17/2011  Time Reviewed:  11:31 AM   Progress in Treatment: Attending groups: Yes Participating in groups:  Yes Taking medication as prescribed: Yes Tolerating medication:  Yes Family/Significant other contact made:  Yes, contact made with mother Patient understands diagnosis:  Yes Discussing patient identified problems/goals with staff:  Yes Medical problems stabilized or resolved:  Yes Denies suicidal/homicidal ideation: Yes Issues/concerns per patient self-inventory:  None identified Other: N/A  New problem(s) identified: None Identified  Reason for Continuation of Hospitalization: Anxiety Depression Medication stabilization  Interventions implemented related to continuation of hospitalization: mood stabilization, medication monitoring and adjustment, group therapy and psycho education, safety checks q 15 mins  Additional comments: N/A  Estimated length of stay: 3-5 days  Discharge Plan: Pt will [facility] up at [facility] for medication management and therapy.   New goal(s): N/A  Review of initial/current patient goals per problem list:    1.  Goal(s): Reduce depressive symptoms  Met:  No  Target date: by discharge  As evidenced by: Reducing depression from a 10 to a 3 as reported by pt.   2.  Goal (s): Reduce/Eliminate suicidal ideation  Met:  No  Target date: by discharge  As evidenced by: pt reporting no SI.    3.  Goal(s): Reduce anxiety symptoms  Met:  No  Target date: by discharge  As evidenced by: Reduce anxiety from a 10 to a 3 as reported by pt.    Attendees: Patient:  Alison Kennedy 05/17/2011 11:32 AM   Family:     Physician:  Orson Aloe, MD  05/17/2011  11:31 AM   Nursing:   Neill Loft, RN 05/17/2011 11:31 AM   Case Manager:  Reyes Ivan, LCSWA 05/17/2011  11:31 AM   Counselor:  Angus Palms, LCSW 05/17/2011  11:31 AM   Other:  Juline Patch, LCSW 05/17/2011  11:31 AM   Other:   Corinne Ports, PhD intern 05/17/2011  11:31 AM   Other:     Other:      Scribe for Treatment Team:   [name], 05/17/2011 , 11:31 AM

## 2011-05-17 NOTE — Progress Notes (Signed)
[facility] MD Progress Note  05/17/2011 6:05 PM  Diagnosis:  Axis I: Major Depression, Recurrent severe  ADL's:  Intact  Sleep: Good  Appetite:  Good  Suicidal Ideation:  Pt denies any suicidal thoughts. Homicidal Ideation:  Denies adamantly any homicidal thoughts.  Mental Status Examination/Evaluation: Objective:  Appearance: Casual  Eye Contact::  Good  Speech:  Clear and Coherent  Volume:  Normal  Mood:  Euthymic  Affect:  Congruent  Thought Process:  Coherent  Orientation:  Full  Thought Content:  WDL  Suicidal Thoughts:  No  Homicidal Thoughts:  No  Memory:  Immediate;   Good  Judgement:  Fair  Insight:  Fair  Psychomotor Activity:  Normal  Concentration:  Good  Recall:  Good  Akathisia:  No  AIMS (if indicated):     Assets:  Communication Skills Desire for Improvement  Sleep:  Number of Hours: 6.75    Vital Signs:Blood pressure 128/80, pulse 68, temperature 98.3 F (36.8 C), temperature source Oral, resp. rate 16, height 5' (1.524 m), weight 68.947 kg (152 lb). Current Medications: Current Facility-Administered Medications  Medication Dose Route Frequency Provider Last Rate Last Dose  . acetaminophen (TYLENOL) tablet 650 mg  650 mg Oral Q6H PRN Larena Sox, MD      . albuterol (PROVENTIL HFA;VENTOLIN HFA) 108 (90 BASE) MCG/ACT inhaler 2 puff  2 puff Inhalation Q6H PRN Wonda Cerise, MD   2 puff at 05/16/11 1846  . alum & mag hydroxide-simeth (MAALOX/MYLANTA) 200-200-20 MG/5ML suspension 30 mL  30 mL Oral Q4H PRN Larena Sox, MD      . buPROPion [facility] SR) 12 hr tablet 100 mg  100 mg Oral BID Mike Craze, MD   100 mg at 05/17/11 1710  . chlordiazePOXIDE (LIBRIUM) capsule 25 mg  25 mg Oral Q6H PRN Larena Sox, MD      . citalopram (CELEXA) tablet 20 mg  20 mg Oral Daily Mickie D. Adams, PA   20 mg at 05/17/11 1610  . hydrOXYzine (ATARAX/VISTARIL) tablet 25 mg  25 mg Oral Q6H PRN Larena Sox, MD      . loperamide (IMODIUM) capsule 2-4 mg  2-4  mg Oral PRN Larena Sox, MD      . magnesium hydroxide (MILK OF MAGNESIA) suspension 30 mL  30 mL Oral Daily PRN Larena Sox, MD      . mulitivitamin with minerals tablet 1 tablet  1 tablet Oral Daily Larena Sox, MD   1 tablet at 05/17/11 0814  . nicotine (NICODERM CQ - dosed in mg/24 hours) patch 21 mg  21 mg Transdermal Q0600 Larena Sox, MD   21 mg at 05/17/11 9604  . ondansetron (ZOFRAN-ODT) disintegrating tablet 4 mg  4 mg Oral Q6H PRN Larena Sox, MD      . thiamine (B-1) injection 100 mg  100 mg Intramuscular Once Larena Sox, MD      . thiamine (VITAMIN B-1) tablet 100 mg  100 mg Oral Daily Larena Sox, MD   100 mg at 05/17/11 5409    Lab Results: No results found for this or any previous visit (from the past 48 hour(s)).  Physical Findings: AIMS:  , ,  ,  ,    CIWA:  CIWA-Ar Total: 0  COWS:     Treatment Plan Summary: Daily contact with patient to assess and evaluate symptoms and progress in treatment Medication management  Plan: Pt seen in treatment team and also  this afternoon where she dicussed her post discharge plans. D/C tomorrow, start Wellbutrin for the nicotine cravings to help her stop smoking.  Smoking is something that she has wanted to stop for some time now.  She has struggled with the cravings.  Will see what Wellbutrin will do to help with that.  Alison Kennedy 05/17/2011, 6:05 PM

## 2011-05-17 NOTE — Progress Notes (Signed)
Pt attended discharge planning group and actively participated.  Pt presents with flat affect and depressed mood.  Pt was open with sharing reason for entering the hospital.  Pt states that she attempted suicide by drinking alcohol and taking sleeping pills.  Pt states that her son killed himself last year and she last attempted suicide herself before her son did.  Pt states that she lives in [place] with her grandmother and has transportation back home.  Pt states that she is unemployed.  Pt states that she doesn't have a psychiatrist or therapist.  Pt requesting information about applying for disability and public housing.  SW will refer pt to [facility] in [place] for medication management and therapy.  Pt ranks depression at a 6 and anxiety at an 8 today.  Pt denies SI.  No further needs voiced by pt at this time.  Safety planning and suicide prevention discussed.     Reyes Ivan, LCSWA 05/17/2011  10:29 AM

## 2011-05-17 NOTE — [facility] Suicide Risk Assessment (Signed)
Suicide Risk Assessment  Discharge Assessment     Demographic factors:  Caucasian;Low socioeconomic status    Current Mental Status Per Nursing Assessment::   On Admission:  Suicidal ideation indicated by patient;Suicide plan;Self-harm thoughts At Discharge:     Loss Factors: Decrease in vocational status;Loss of significant relationship;Financial problems / change in socioeconomic status  Historical Factors: Prior suicide attempts;Impulsivity  Continued Clinical Symptoms:  Severe Anxiety and/or Agitation Depression:   Anhedonia Comorbid alcohol abuse/dependence Alcohol/Substance Abuse/Dependencies Previous Psychiatric Diagnoses and Treatments  Discharge Diagnoses:   AXIS I:  Alcohol Abuse, Major Depression, Recurrent severe and Nicotine Dependence AXIS II:  Deferred AXIS III:   Past Medical History  Diagnosis Date  . Depression   . Alcohol abuse   . Heart murmur    AXIS IV:  other psychosocial or environmental problems AXIS V:  51-60 moderate symptoms  Cognitive Features That Contribute To Risk:  Thought constriction (tunnel vision)    Suicide Risk:  Minimal: No identifiable suicidal ideation.  Patients presenting with no risk factors but with morbid ruminations; may be classified as minimal risk based on the severity of the depressive symptoms  Current Mental Status Per Physician: Diagnosis:  Axis I: Major Depression, Recurrent severe  ADL's:  Intact  Sleep: Good  Appetite:  Good  Suicidal Ideation:  Pt denies any suicidal thoughts. Homicidal Ideation:  Denies adamantly any homicidal thoughts.  Mental Status Examination/Evaluation: Objective:  Appearance: Casual  Eye Contact::  Good  Speech:  Clear and Coherent  Volume:  Normal  Mood:  Euthymic  Affect:  Congruent  Thought Process:  Coherent  Orientation:  Full  Thought Content:  WDL  Suicidal Thoughts:  No  Homicidal Thoughts:  No  Memory:  Immediate;   Good  Judgement:  Fair  Insight:  Fair   Psychomotor Activity:  Normal  Concentration:  Good  Recall:  Good  Akathisia:  No  AIMS (if indicated):     Assets:  Communication Skills Desire for Improvement  Sleep:  Number of Hours: 6.75    Vital Signs:Blood pressure 128/80, pulse 68, temperature 98.3 F (36.8 C), temperature source Oral, resp. rate 16, height 5' (1.524 m), weight 68.947 kg (152 lb). Current Medications: Current Facility-Administered Medications  Medication Dose Route Frequency Provider Last Rate Last Dose  . acetaminophen (TYLENOL) tablet 650 mg  650 mg Oral Q6H PRN Larena Sox, MD      . albuterol (PROVENTIL HFA;VENTOLIN HFA) 108 (90 BASE) MCG/ACT inhaler 2 puff  2 puff Inhalation Q6H PRN Wonda Cerise, MD   2 puff at 05/16/11 1846  . alum & mag hydroxide-simeth (MAALOX/MYLANTA) 200-200-20 MG/5ML suspension 30 mL  30 mL Oral Q4H PRN Larena Sox, MD      . buPROPion [facility] SR) 12 hr tablet 100 mg  100 mg Oral BID Mike Craze, MD   100 mg at 05/17/11 1710  . chlordiazePOXIDE (LIBRIUM) capsule 25 mg  25 mg Oral Q6H PRN Larena Sox, MD      . citalopram (CELEXA) tablet 20 mg  20 mg Oral Daily Mickie D. Adams, PA   20 mg at 05/17/11 1610  . hydrOXYzine (ATARAX/VISTARIL) tablet 25 mg  25 mg Oral Q6H PRN Larena Sox, MD      . loperamide (IMODIUM) capsule 2-4 mg  2-4 mg Oral PRN Larena Sox, MD      . magnesium hydroxide (MILK OF MAGNESIA) suspension 30 mL  30 mL Oral Daily PRN Larena Sox, MD      .  mulitivitamin with minerals tablet 1 tablet  1 tablet Oral Daily Larena Sox, MD   1 tablet at 05/17/11 0814  . nicotine (NICODERM CQ - dosed in mg/24 hours) patch 21 mg  21 mg Transdermal Q0600 Larena Sox, MD   21 mg at 05/17/11 1610  . ondansetron (ZOFRAN-ODT) disintegrating tablet 4 mg  4 mg Oral Q6H PRN Larena Sox, MD      . thiamine (B-1) injection 100 mg  100 mg Intramuscular Once Larena Sox, MD      . thiamine (VITAMIN B-1) tablet 100 mg  100 mg Oral Daily  Larena Sox, MD   100 mg at 05/17/11 9604    Lab Results:   No results found for this or any previous visit (from the past 72 hour(s)).  RISK REDUCTION FACTORS: What pt has learned from hospital stay is that there is more positive about herself than she thought.  Risk of self harm is elevated by he depression and her addictions, but she has herself and her kids to live for.  Risk of harm to others is minimal in that she has not been involved in fights or had any legal charges filed on her.  Pt seen in treatment team where she described the above findings.  She agrees to stop smoking.  PLAN: Discharge home Continue Medication List  As of 05/17/2011  6:17 PM   TAKE these medications      Indication    albuterol 108 (90 BASE) MCG/ACT inhaler   Commonly known as: PROVENTIL HFA;VENTOLIN HFA   Inhale 2 puffs into the lungs every 6 (six) hours as needed for wheezing or shortness of breath.       buPROPion 100 MG 12 hr tablet   Commonly known as: WELLBUTRIN SR   Take 1 tablet (100 mg total) by mouth 2 (two) times daily. For depression and smoking cessation       citalopram 20 MG tablet   Commonly known as: CELEXA   Take 1 tablet (20 mg total) by mouth daily. For depression.       mulitivitamin with minerals Tabs   Take 1 tablet by mouth daily. For nutritional supplementation.\       nicotine 21 mg/24hr patch   Commonly known as: NICODERM CQ - dosed in mg/24 hours   Place 1 patch onto the skin daily at 6 (six) AM. For smoking cessation       thiamine 100 MG tablet   Take 1 tablet (100 mg total) by mouth daily. For nutritional supplementation.\            Follow-up recommendations:  Activities: Resume typical activities Diet: Resume typical diet Other: Follow up with outpatient provider and report any side effects to out patient prescriber.  Plan: Pt seen in treatment team and also this afternoon where she dicussed her post discharge plans. D/C tomorrow, start  Wellbutrin for the nicotine cravings to help her stop smoking.  Smoking is something that she has wanted to stop for some time now.  She has struggled with the cravings.  Will see what Wellbutrin will do to help with that.  Alison Kennedy 05/17/2011, 6:17 PM

## 2011-05-17 NOTE — Progress Notes (Addendum)
[facility] Group Notes: (Counselor/Nursing/MHT/Case Management/Adjunct) 05/17/2011   @  11:00am Overcoming Obstacles to Wellness   Type of Therapy:  Group Therapy  Participation Level:  Minial  Participation Quality: Attentive    Affect:  Blunted  Cognitive:  Appropriate  Insight:  None  Engagement in Group: Minimal  Engagement in Therapy:  None  Modes of Intervention:  Support and Exploration  Summary of Progress/Problems: Alison Kennedy  was attentive but not engaged in group process    Alison Kennedy 05/17/2011 2:20 PM       [facility] Group Notes: (Counselor/Nursing/MHT/Case Management/Adjunct) 05/17/2011   @1 :15pm Breathing & Meditation for Anxiety/Anger   Type of Therapy:  Group Therapy  Participation Level:  Limited  Participation Quality: Attentive    Affect:  Blunted  Cognitive:  Appropriate  Insight:  None  Engagement in Group: Good  Engagement in Therapy:  Limited  Modes of Intervention:  Support and Exploration  Summary of Progress/Problems: Alison Kennedy engaged in color breathing and safe place meditation but did not share her experience with the group.   Alison Kennedy 05/17/2011 2:53 PM

## 2011-05-17 NOTE — Progress Notes (Signed)
Pt pleasant and cooperative. Pt attends groups and interacts well with peers and staff. Pt was offered support and encouragement. Pt receptive to treatment and safety maintained on unit.

## 2011-05-18 MED ORDER — CITALOPRAM HYDROBROMIDE 20 MG PO TABS
20.0000 mg | ORAL_TABLET | Freq: Every day | ORAL | Status: DC
  Filled 2011-05-18: qty 14

## 2011-05-18 MED ORDER — ADULT MULTIVITAMIN W/MINERALS CH
1.0000 | ORAL_TABLET | Freq: Every day | ORAL | Status: DC
  Filled 2011-05-18: qty 14

## 2011-05-18 MED ORDER — VITAMIN B-1 100 MG PO TABS
100.0000 mg | ORAL_TABLET | Freq: Every day | ORAL | Status: DC
  Filled 2011-05-18: qty 14

## 2011-05-18 MED ORDER — BUPROPION HCL ER (SR) 100 MG PO TB12
100.0000 mg | ORAL_TABLET | Freq: Two times a day (BID) | ORAL | Status: DC
  Filled 2011-05-18: qty 28

## 2011-05-18 NOTE — Discharge Summary (Signed)
I agree with this D/C Summary.  

## 2011-05-18 NOTE — Progress Notes (Signed)
[facility] MD Progress Note  05/18/2011 1:44 PM  Diagnosis:  Axis I: Major Depression, Recurrent severe  ADL's:  Intact  Sleep: Good  Appetite:  Good  Suicidal Ideation:  Pt denies any suicidal thoughts. Homicidal Ideation:  Denies adamantly any homicidal thoughts.  Mental Status Examination/Evaluation: Objective:  Appearance: Casual  Eye Contact::  Good  Speech:  Clear and Coherent  Volume:  Normal  Mood:  Euthymic  Affect:  Congruent  Thought Process:  Coherent  Orientation:  Full  Thought Content:  WDL  Suicidal Thoughts:  No  Homicidal Thoughts:  No  Memory:  Immediate;   Good  Judgement:  Fair  Insight:  Fair  Psychomotor Activity:  Normal  Concentration:  Good  Recall:  Good  Akathisia:  No  AIMS (if indicated):     Assets:  Communication Skills Desire for Improvement  Sleep:  Number of Hours: 5    ROS: Neuro: no headaches, ataxia, weakness  MS: no weakness, muscle cramps, aches.  GI: no N/V/D/cramps/constipation  Vital Signs:Blood pressure 128/84, pulse 73, temperature 97.4 F (36.3 C), temperature source Oral, resp. rate 16, height 5' (1.524 m), weight 68.947 kg (152 lb). Current Medications: Current Facility-Administered Medications  Medication Dose Route Frequency Provider Last Rate Last Dose  . acetaminophen (TYLENOL) tablet 650 mg  650 mg Oral Q6H PRN Larena Sox, MD      . albuterol (PROVENTIL HFA;VENTOLIN HFA) 108 (90 BASE) MCG/ACT inhaler 2 puff  2 puff Inhalation Q6H PRN Wonda Cerise, MD   2 puff at 05/16/11 1846  . alum & mag hydroxide-simeth (MAALOX/MYLANTA) 200-200-20 MG/5ML suspension 30 mL  30 mL Oral Q4H PRN Larena Sox, MD      . buPROPion [facility] SR) 12 hr tablet 100 mg  100 mg Oral BID Mike Craze, MD   100 mg at 05/18/11 0823  . chlordiazePOXIDE (LIBRIUM) capsule 25 mg  25 mg Oral Q6H PRN Larena Sox, MD      . citalopram (CELEXA) tablet 20 mg  20 mg Oral Daily Mickie D. Adams, PA   20 mg at 05/18/11 1914  . hydrOXYzine  (ATARAX/VISTARIL) tablet 25 mg  25 mg Oral Q6H PRN Larena Sox, MD      . loperamide (IMODIUM) capsule 2-4 mg  2-4 mg Oral PRN Larena Sox, MD      . magnesium hydroxide (MILK OF MAGNESIA) suspension 30 mL  30 mL Oral Daily PRN Larena Sox, MD      . mulitivitamin with minerals tablet 1 tablet  1 tablet Oral Daily Larena Sox, MD   1 tablet at 05/18/11 0823  . nicotine (NICODERM CQ - dosed in mg/24 hours) patch 21 mg  21 mg Transdermal Q0600 Larena Sox, MD   21 mg at 05/18/11 0612  . ondansetron (ZOFRAN-ODT) disintegrating tablet 4 mg  4 mg Oral Q6H PRN Larena Sox, MD      . thiamine (VITAMIN B-1) tablet 100 mg  100 mg Oral Daily Larena Sox, MD   100 mg at 05/18/11 0823  . DISCONTD: buPROPion (WELLBUTRIN SR) 12 hr tablet 100 mg  100 mg Oral BID Mike Craze, MD      . DISCONTD: citalopram (CELEXA) tablet 20 mg  20 mg Oral Daily Mike Craze, MD      . DISCONTD: mulitivitamin with minerals tablet 1 tablet  1 tablet Oral Daily Mike Craze, MD      . DISCONTD: thiamine (B-1) injection  100 mg  100 mg Intramuscular Once Larena Sox, MD      . DISCONTD: thiamine (VITAMIN B-1) tablet 100 mg  100 mg Oral Daily Mike Craze, MD        Lab Results: No results found for this or any previous visit (from the past 48 hour(s)).  Physical Findings: AIMS:  , ,  ,  ,    CIWA:  CIWA-Ar Total: 0  COWS:     Treatment Plan Summary: Daily contact with patient to assess and evaluate symptoms and progress in treatment Medication management  Plan: Pt doing well on meds.  She agrees to stop smoking and to start back in AA as well as start in [facility] to help her learn how to take better care of herself.  Jidenna Figgs 05/18/2011, 1:44 PM

## 2011-05-18 NOTE — Progress Notes (Signed)
[facility] Group Notes: (Counselor/Nursing/MHT/Case Management/Adjunct) 05/18/2011   @ 11:00 Feelings About Diagnosis  Type of Therapy:  Group Therapy  Participation Level:  Limited  Participation Quality: Attentive,  supportive   Affect:  Appropriate  Cognitive:  Appropriate  Insight:  Good  Engagement in Group: Limited  Engagement in Therapy:  Minimal  Modes of Intervention:  Support and Exploration  Summary of Progress/Problems: Alison Kennedy did not engage much, except to encourage another group member not to listen to others, and to form her own opinions about herself.    Alison Kennedy 05/18/2011  2:08 PM

## 2011-05-18 NOTE — Tx Team (Signed)
Interdisciplinary Treatment Plan Update (Adult)  Date:  05/18/2011  Time Reviewed:  10:13 AM   Progress in Treatment: Attending groups: Yes Participating in groups:  Yes Taking medication as prescribed: Yes Tolerating medication:  Yes Family/Significant other contact made:  Yes Patient understands diagnosis:  Yes Discussing patient identified problems/goals with staff:  Yes Medical problems stabilized or resolved:  Yes Denies suicidal/homicidal ideation: Yes Issues/concerns per patient self-inventory:  None identified Other: N/A  New problem(s) identified: None Identified  Reason for Continuation of Hospitalization: Stable to d/c  Interventions implemented related to continuation of hospitalization: Stable to d/c  Additional comments: N/A  Estimated length of stay: D/C today  Discharge Plan: Pt will follow up at [facility] for medication management and therapy.    New goal(s): N/A  Review of initial/current patient goals per problem list:    1.  Goal(s): Reduce depressive symptoms  Met:  Yes  Target date: by discharge  As evidenced by: Reducing depression from a 10 to a 3 as reported by pt. Pt denies depression today.    2.  Goal (s): Reduce/Eliminate suicidal ideation  Met:  Yes  Target date: by discharge  As evidenced by: Pt denies SI.   3.  Goal(s): Reduce anxiety symptoms  Met:  Yes  Target date: by discharge  As evidenced by: Reduce anxiety from a 10 to a 3 as reported by pt. Pt denies anxiety today.    Attendees: Patient: Alison Kennedy 05/18/2011 10:45 AM  Family:     Physician:  Orson Aloe, MD  05/18/2011  10:13 AM   Nursing:   Neill Loft, RN 05/18/2011 10:15 AM   Case Manager:  Reyes Ivan, LCSWA 05/18/2011  10:13 AM   Counselor:  Angus Palms, LCSW 05/18/2011  10:13 AM   Other:  Juline Patch, LCSW 05/18/2011  10:13 AM   Other:      Other:     Other:      Scribe for Treatment Team:   [name], 05/18/2011 , 10:13  AM

## 2011-05-18 NOTE — Progress Notes (Signed)
Grief and Loss Group  Co-facilitated group on [address] hall w/ Counseling Intern Corliss Marcus, LPCA, NCC. Reviewed/established group rules (re: privacy, respect) and introduced topic of loss and grief reactions. Group was highly engaged w/ facilitators and w/ one another, characterized by open sharing and support. Group focused on types of loss, largely on death, unexpected death, and suicide.  Pt was active and engaged in the group. Pt shared the loss of her son to suicide and how she has been unable to grieve. Pt stated that she was unable to attend her son's funeral and has not yet found closure. Pt was very supportive of other group members and shared that what has worked for her when feeling very depressed is to look for small things that make her happy.  Alison Kennedy B MS, LPCA, NCC

## 2011-05-18 NOTE — Progress Notes (Signed)
[facility] Case Management Discharge Plan:  Will you be returning to the same living situation after discharge: Yes,  return home At discharge, do you have transportation home?:Yes,  mother to pick pt up Do you have the ability to pay for your medications:Yes,  access to meds  Release of information consent forms completed and in the chart;  Patient's signature needed at discharge.  Patient to Follow up at:  Follow-up Information    Follow up with Alison Kennedy on 05/20/2011. (Appointment scheduled at 7:45 am, auth # [id number])    Contact information:   [address] [place], [place] [address] [phone]         Patient denies SI/HI:   Yes,  denies SI/HI today    Safety Planning and Suicide Prevention discussed:  Yes,  discussed with pt today  Barrier to discharge identified:No.  Summary and Recommendations: Pt attended discharge planning group and actively participated.  Pt presents with calm mood and affect. Pt denies having depression, anxiety and SI.  Pt reports feeling stable to d/c today.  No recommendations from SW.  No further needs voiced by pt.  Pt stable to discharge.     Alison Kennedy 05/18/2011, 9:55 AM

## 2011-05-18 NOTE — Discharge Summary (Signed)
Physician Discharge Summary Note  Patient:  Alison Kennedy is an 46 y.o., female MRN:  [id number] DOB:  07-01-1965 Patient phone:  [phone] (home)  Patient address:   [address] [place] [address],   Date of Admission:  05/14/2011 Date of Discharge: 05/18/11  Reason for Admission: Suicide attempts.  Discharge Diagnoses: Principal Problem:  *Alcohol causing toxic effect, subsequent encounter suicide attempt OD with trazadone  Active Problems:  Nicotine dependence  Bereavement son suicided   Major depression   Axis Diagnosis:   AXIS I:  Alcohol causing toxic effect, subsequent encounter suicide attempt OD with trazadone  AXIS II:  Deferred AXIS III:   Past Medical History  Diagnosis Date  . Depression   . Alcohol abuse   . Heart murmur    AXIS IV:  other psychosocial or environmental problems AXIS V:  70  Level of Care:  OP  Hospital Course: Called her mother and asked to be committed to get help. Also let her mother know that she had taken trazadone in a suicide attempt. Hasn't been here for 2 years, but had a similar presentation then. Says that she has no outside care as she is not employed.  Reports having been depressed since she was a teen and has abused alcohol to "self medicate"   Upon admission to this hospital, [name] received medication management. She was prescribed and received Wellbutrin SR 150 mg daily for smoking cessation/depressive mood, Citalopram 20 mg daily for depression and Nicotine patch to help with Nicotine withdrawal. She also received medication management and monitoring for his other health issues. She tolerated her treatment regimen without any significant adverse effects and or reactions.  Patient was also enrolled in group counseling sessions and activities. She participated accordingly on daily basis. As her treatment regimen continued together with her group counseling sessions, it was obvious that the combination therapies  proved to be effective in managing and controlling patient's symptoms. This is evidenced by patient's reports of improved mood and presentation of appropriate affect.  Patient attended the treatment team meeting this am and stated that she is stable for discharge. She will continue psychiatric care on outpatient to maintain stability. She will follow-up at [facility] in [place] on 05/20/11. The address, date and time for this appointment provided for patient. Patient upon discharge adamantly denies suicidal, homicidal ideations, auditory, visual hallucinations and delusional thinking. Patient left [facility] with all personal belongings via family transport in no apparent distress.       Consults:  None  Significant Diagnostic Studies:  labs: CBC, CMP, Urinalysis, UDS  Discharge Vitals:   Blood pressure 128/84, pulse 73, temperature 97.4 F (36.3 C), temperature source Oral, resp. rate 16, height 5' (1.524 m), weight 68.947 kg (152 lb).  Mental Status Exam: See Mental Status Examination and Suicide Risk Assessment completed by Attending Physician prior to discharge.  Discharge destination:  Home  Is patient on multiple antipsychotic therapies at discharge:  No   Has Patient had three or more failed trials of antipsychotic monotherapy by history:  No  Recommended Plan for Multiple Antipsychotic Therapies: NA   Medication List  As of 05/18/2011  2:16 PM   TAKE these medications      Indication    albuterol 108 (90 BASE) MCG/ACT inhaler   Commonly known as: PROVENTIL HFA;VENTOLIN HFA   Inhale 2 puffs into the lungs every 6 (six) hours as needed for wheezing or shortness of breath.       buPROPion 100 MG  12 hr tablet   Commonly known as: WELLBUTRIN SR   Take 1 tablet (100 mg total) by mouth 2 (two) times daily. For depression and smoking cessation       citalopram 20 MG tablet   Commonly known as: CELEXA   Take 1 tablet (20 mg total) by mouth daily. For depression.       mulitivitamin  with minerals Tabs   Take 1 tablet by mouth daily. For nutritional supplementation.\       nicotine 21 mg/24hr patch   Commonly known as: NICODERM CQ - dosed in mg/24 hours   Place 1 patch onto the skin daily at 6 (six) AM. For smoking cessation       thiamine 100 MG tablet   Take 1 tablet (100 mg total) by mouth daily. For nutritional supplementation.\            Follow-up Information    Follow up with Arna Medici on 05/20/2011. (Appointment scheduled at 7:45 am, auth # [id number])    Contact information:   [address] [place], [place] [address] [phone]         Follow-up recommendations:  Activity:  as tolerated Other:  Keep all scheduled follow-up appointments as recommended.  Comments:  Take all your medications as prescribed. Report to your outpatient provider, any adverse effects from your medicines promptly. Patient is instructed to not engage in alcohol and or illegal drug use while on prescription medincines.  SignedArmandina Stammer I 05/18/2011, 2:16 PM

## 2011-05-18 NOTE — Discharge Instructions (Signed)
Attend 90 meetings in 90 days. Get trusted sponsor from the advise of others or from whomever in meetings seems to make sense, has a proven track record, will hold you responsible for your sobriety, and both expects and insists on total abstinence.  Work the steps HONESTLY with the trusted sponsor. Get obsessed with your recovery by often reminding yourself of how DEADLY this dredged horrible disease of addiction JUST IS. Focus the first month on speaker meetings where you specifically look at how your life has been wrecked by drugs/alcohol and how your life has been similar to that of the speakers.  Strongly consider attending at least 6 Alanon Meetings to help you learn about how your helping others to the exclusion of helping yourself is actually hurting yourself and is actually an addiction to fixing others and that you need to work the 12 Step to Happiness through the [employer]. Al-Anon Family Groups could be helpful with how to deal with substance abusing family and friends. Or your own issues of being in victim role.  There are only 40 Alanon Family Group meetings a week here in [place].  There are [employer].  Search on line and there you can learn the format and can access the schedule for yourself.  They ask you to temproarily block call waiting by starting with *70 then their number is [phone]  For smoking cessation, should continue 21 mg daily for 3 weeks, then 14 mg daily for 2 weeks, then 7 mg daily for 2 final weeks.  Call the Smoking Cessation hotline at  587-302-9815  for support.  REMEMBER these cost the about the same as a pack of cigarettes a day, but when you stop, you have $5 a day more to spend on yourself and your health.

## 2011-05-21 NOTE — Progress Notes (Signed)
Patient Discharge Instructions:  After Visit Summary (AVS):   Faxed to:  05/20/2011 Psychiatric Admission Assessment Note:   Faxed to:  05/20/2011 Suicide Risk Assessment - Discharge Assessment:   Faxed to:  05/20/2011 Faxed/Sent to the Next Level Care provider:  05/20/2011  Faxed to [facility] @ [phone]  Heloise Purpura, Eduard Clos, 05/21/2011, 11:30 AM

## 2011-06-14 ENCOUNTER — Telehealth: Payer: Self-pay | Admitting: Internal Medicine

## 2011-06-14 NOTE — Telephone Encounter (Signed)
Recall Letter sent 06/03/11. 

## 2011-06-29 ENCOUNTER — Telehealth: Payer: Self-pay | Admitting: Internal Medicine

## 2011-06-29 NOTE — Telephone Encounter (Signed)
Sent certified letter.  Came back as unable to forward 06/29/11 Alison Kennedy

## 2012-07-16 IMAGING — CR DG CHEST 2V
2 series · 2 of 2 positions shown · non-contrast
Comparison: None.

CLINICAL DATA: Cough, fever

CHEST - 2 VIEW

[view not recorded (1 of 2)]
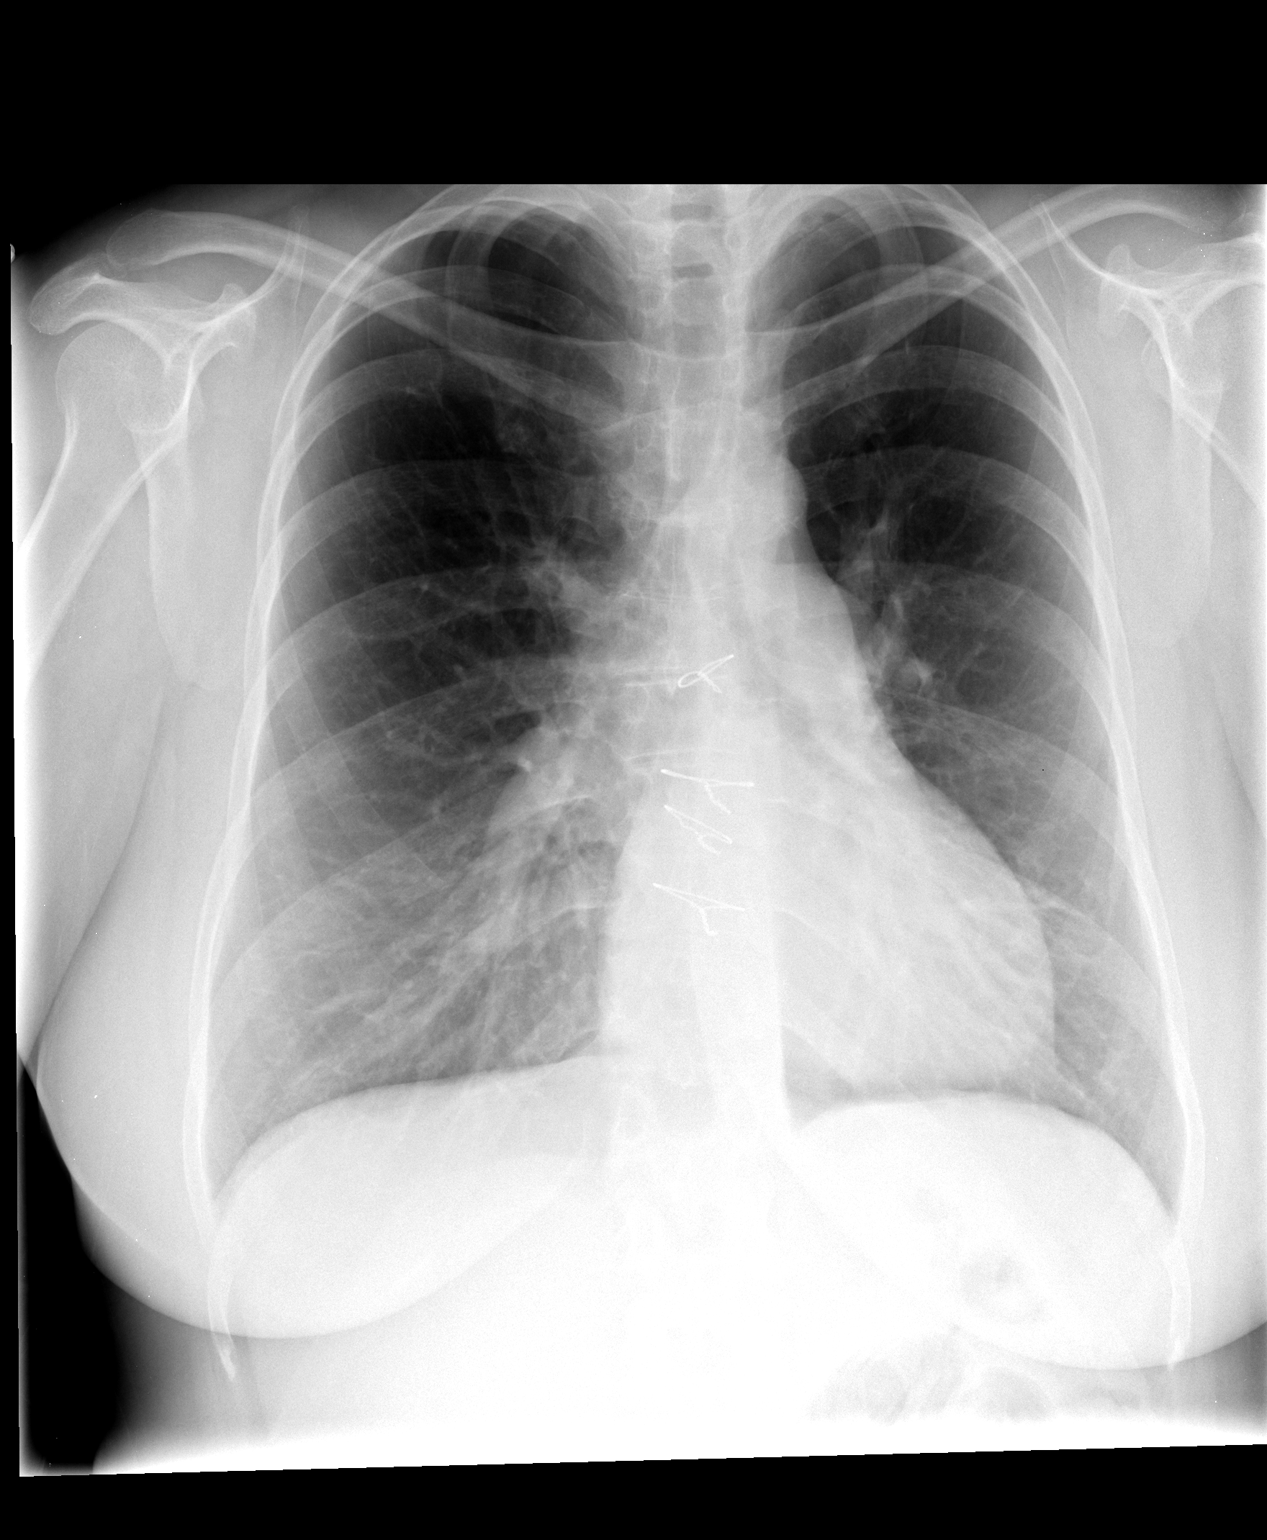

[view not recorded (2 of 2)]
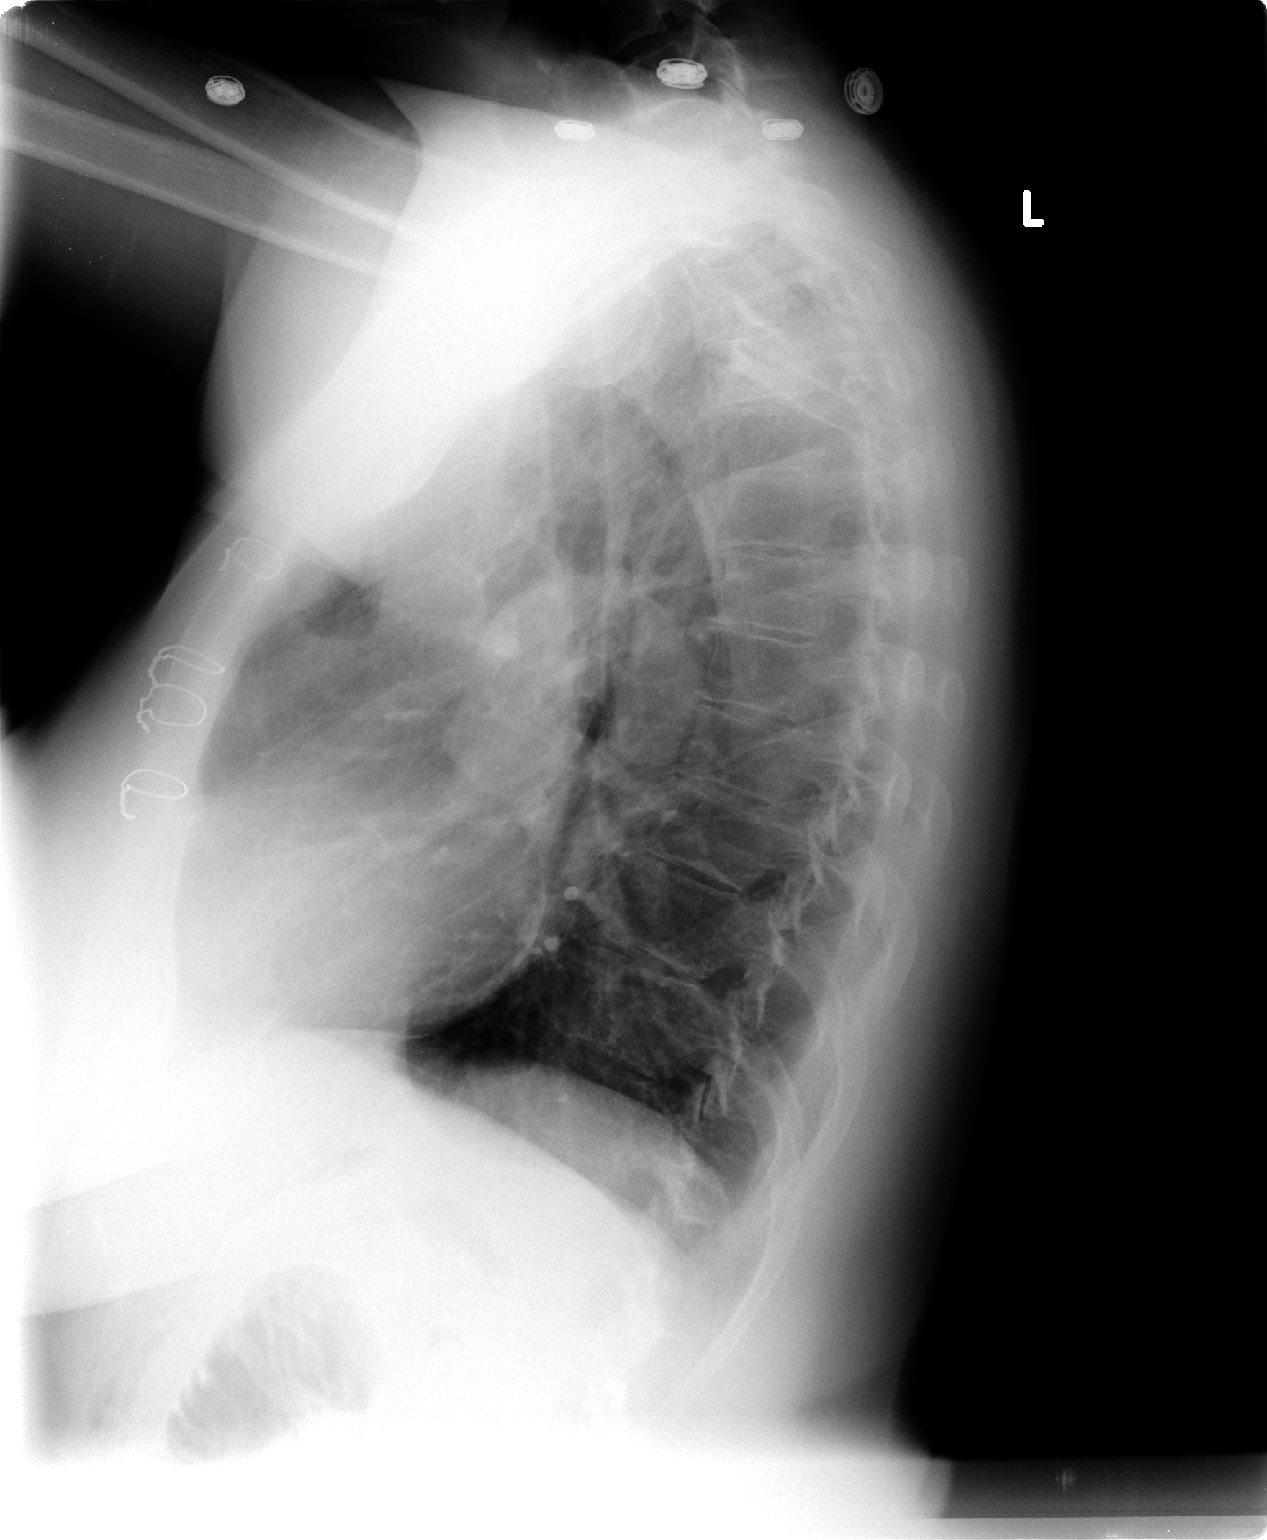

[2 of 2 positions shown; findings below may reference images not displayed]

FINDINGS: The lungs are clear and slightly hyperaerated.
Mediastinal contours appear normal.  The heart is within normal
limits in size.  There are mediastinal sutures present.  No acute
bony abnormality is seen.
IMPRESSION: No active lung disease.  Median sternotomy sutures noted.

## 2017-07-03 ENCOUNTER — Encounter (HOSPITAL_COMMUNITY): Payer: Self-pay | Admitting: Emergency Medicine

## 2017-07-03 ENCOUNTER — Emergency Department (HOSPITAL_COMMUNITY)
Admission: EM | Admit: 2017-07-03 | Discharge: 2017-07-03 | Disposition: A | Discharge status: Home / Self Care | Payer: BLUE CROSS/BLUE SHIELD | Attending: Emergency Medicine | Admitting: Emergency Medicine

## 2017-07-03 ENCOUNTER — Other Ambulatory Visit: Payer: Self-pay

## 2017-07-03 ENCOUNTER — Emergency Department (HOSPITAL_COMMUNITY): Payer: BLUE CROSS/BLUE SHIELD

## 2017-07-03 DIAGNOSIS — R0789 Other chest pain: Secondary | ICD-10-CM | POA: Diagnosis not present

## 2017-07-03 DIAGNOSIS — Z79899 Other long term (current) drug therapy: Secondary | ICD-10-CM | POA: Insufficient documentation

## 2017-07-03 DIAGNOSIS — F17210 Nicotine dependence, cigarettes, uncomplicated: Secondary | ICD-10-CM | POA: Diagnosis not present

## 2017-07-03 DIAGNOSIS — M25512 Pain in left shoulder: Secondary | ICD-10-CM | POA: Insufficient documentation

## 2017-07-03 DIAGNOSIS — R079 Chest pain, unspecified: Secondary | ICD-10-CM | POA: Diagnosis not present

## 2017-07-03 DIAGNOSIS — M5414 Radiculopathy, thoracic region: Secondary | ICD-10-CM | POA: Diagnosis not present

## 2017-07-03 LAB — BASIC METABOLIC PANEL
Anion gap: 8 (ref 5–15)
BUN: 19 mg/dL (ref 6–20)
CO2: 22 mmol/L (ref 22–32)
Calcium: 9 mg/dL (ref 8.9–10.3)
Chloride: 109 mmol/L (ref 98–111)
Creatinine, Ser: 0.64 mg/dL (ref 0.44–1.00)
GFR calc Af Amer: >60 mL/min (ref >60)
GFR calc non Af Amer: >60 mL/min (ref >60)
Glucose, Bld: 91 mg/dL (ref 70–99)
Potassium: 4.2 mmol/L (ref 3.5–5.1)
Sodium: 139 mmol/L (ref 135–145)

## 2017-07-03 LAB — CBC
HCT: 41.7 % (ref 36.0–46.0)
Hemoglobin: 13.7 g/dL (ref 12.0–15.0)
MCH: 32.4 pg (ref 26.0–34.0)
MCHC: 32.9 g/dL (ref 30.0–36.0)
MCV: 98.6 fL (ref 78.0–100.0)
Platelets: 202 10*3/uL (ref 150–400)
RBC: 4.23 MIL/uL (ref 3.87–5.11)
RDW: 12.6 % (ref 11.5–15.5)
WBC: 9.2 10*3/uL (ref 4.0–10.5)

## 2017-07-03 LAB — TROPONIN I: Troponin I: <0.03 ng/mL (ref <0.03)

## 2017-07-03 MED ORDER — GABAPENTIN 100 MG PO CAPS: 100.0000 mg | ORAL_CAPSULE | Freq: Three times a day (TID) | ORAL | 0 refills | Status: DC

## 2017-07-03 NOTE — ED Triage Notes (Signed)
Pt c/o left sided chest burning sensation x 2 weeks.

## 2017-07-04 NOTE — ED Provider Notes (Signed)
[facility] EMERGENCY DEPARTMENT Provider Note   CSN: [id number] Arrival date & time: 07/03/17  1927     History   Chief Complaint Chief Complaint  Patient presents with  . Chest Pain    HPI Alison Kennedy is a 52 y.o. female.  HPI   52 year old female with chest and left scapular pain.  Onset about 2 weeks persistent.  Describes a burning sensation that wraps around from her left axilla into her mid thoracic spine.  Fairly constant.  No weakness.  No acute urinary complaints.  No fevers or chills.  Past Medical History:  Diagnosis Date  . Alcohol abuse   . Depression   . Heart murmur     Patient Active Problem List   Diagnosis Date Noted  . Major depression 05/17/2011  . Alcohol causing toxic effect, subsequent encounter suicide attempt OD with trazadone  05/15/2011  . Bereavement son suicided  05/15/2011  . Obstructive lung disease (HCC) 03/19/2011  . Nicotine dependence 03/19/2011  . Encounter for examination for driving license 40/98/1191  . Alcohol intoxication (HCC) 09/10/2010    Past Surgical History:  Procedure Laterality Date  . 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 Authorizing Provider  albuterol (PROVENTIL HFA;VENTOLIN HFA) 108 (90 BASE) MCG/ACT inhaler Inhale 2 puffs into the lungs every 6 (six) hours as needed for wheezing or shortness of breath. 05/17/11 07/03/17 Yes Mike Craze, MD  ibuprofen (ADVIL,MOTRIN) 200 MG tablet Take 800 mg by mouth every 8 (eight) hours as needed.   Yes [provider]  gabapentin (NEURONTIN) 100 MG capsule Take 1 capsule (100 mg total) by mouth 3 (three) times daily. 07/03/17   Raeford Razor, MD  ARIPiprazole (ABILIFY) 2 MG tablet Take 2 mg by mouth daily.    03/19/11  [provider]  FLUoxetine (PROZAC) 40 MG capsule Take 40 mg by mouth daily.     03/19/11  [provider]  zolpidem (AMBIEN) 10 MG tablet Take 10 mg by mouth at bedtime.    03/19/11  [provider]    Family History Family History  Problem Relation Age of Onset  . Asthma Son   . Asthma Brother   . Heart disease Maternal Grandfather   . COPD Maternal Grandfather     Social History Social History   Tobacco Use  . Smoking status: Current Every Day Smoker    Packs/day: 2.00    Years: 30.00    Pack years: 60.00    Types: Cigarettes  . Smokeless tobacco: Never Used  Substance Use Topics  . Alcohol use: Yes    Comment: fomer heavy drinker  . Drug use: No    Comment: per pt's father     Allergies   Patient has no known allergies.   Review of Systems Review of Systems  All systems reviewed and negative, other than as noted in HPI. Physical Exam Updated Vital Signs BP (!) 153/90   Pulse 70   Temp 99.5 F (37.5 C)   Resp 20   Ht 5' (1.524 m)   Wt 62.6 kg (138 lb)   SpO2 98%   BMI 26.95 kg/m   Physical Exam  Constitutional: She appears well-developed and well-nourished. No distress.  HENT:  Head: Normocephalic and atraumatic.  Eyes: Conjunctivae are  normal. Right eye exhibits no discharge. Left eye exhibits no discharge.  Neck: Neck supple.  Cardiovascular: Normal rate, regular rhythm and normal heart sounds. Exam reveals no gallop and no friction rub.  No murmur heard. Pulmonary/Chest: Effort normal and breath sounds normal. No respiratory distress.  Abdominal: Soft. She exhibits no distension. There is no tenderness.  Musculoskeletal: She exhibits no edema or tenderness.  Neurological: She is alert.  Skin: Skin is warm and dry.  Psychiatric: She has a normal mood and affect. Her behavior is normal. Thought content normal.  Nursing note and vitals reviewed.    ED Treatments / Results  Labs (all labs ordered are listed, but only abnormal results are displayed) Labs Reviewed  BASIC METABOLIC PANEL  CBC  TROPONIN  I    EKG EKG Interpretation  Date/Time:  Sunday July 03 2017 19:57:52 EDT Ventricular Rate:  79 PR Interval:  108 QRS Duration: 82 QT Interval:  384 QTC Calculation: 440 R Axis:   92 Text Interpretation:  Sinus rhythm with short PR with frequent Premature ventricular complexes Rightward axis Borderline ECG Confirmed by Cadyn Rodger ([id number]) on 07/03/2017 10:03:46 PM   Radiology Dg Chest 2 View  Result Date: 07/03/2017 CLINICAL DATA:  Left-sided chest pain for 2 weeks EXAM: CHEST - 2 VIEW COMPARISON:  12/04/2010 FINDINGS: Postsurgical changes are again noted. Lungs are hyperinflated without focal infiltrate or sizable effusion. Mild vascular prominence is noted centrally without interstitial edema. No bony abnormality is seen. IMPRESSION: Mild vascular congestion without acute edema. Electronically Signed   By: Mark  Lukens M.D.   On: 07/03/2017 20:30    Procedures Procedures (including critical care time)  Medications Ordered in ED Medications - No data to display   Initial Impression / Assessment and Plan / ED Course  I have reviewed the triage vital signs and the nursing notes.  Pertinent labs & imaging results that were available during my care of the patient were reviewed by me and considered in my medical decision making (see chart for details).    52 year old female with burning sensation in her left axilla into her upper thoracic back.  I suspect she may have a lower cervical upper thoracic neuropathy.  Her exam is really nonfocal.  We will try a course of gabapentin.  I doubt emergent process such as ACS, PE, dissection, etc.  Final Clinical Impressions(s) / ED Diagnoses   Final diagnoses:  Neuropathy, thoracic (radicular)    ED Discharge Orders        Ordered    gabapentin (NEURONTIN) 100 MG capsule  3 times daily     06 /30/19 2319       Raeford RazorKohut, Vitalia Stough, MD 07/04/17 2335

## 2017-08-01 DIAGNOSIS — R011 Cardiac murmur, unspecified: Secondary | ICD-10-CM | POA: Diagnosis not present

## 2017-08-01 DIAGNOSIS — M542 Cervicalgia: Secondary | ICD-10-CM | POA: Diagnosis not present

## 2017-08-01 DIAGNOSIS — F5101 Primary insomnia: Secondary | ICD-10-CM | POA: Diagnosis not present

## 2017-08-01 DIAGNOSIS — J449 Chronic obstructive pulmonary disease, unspecified: Secondary | ICD-10-CM | POA: Diagnosis not present

## 2017-08-11 DIAGNOSIS — Z79899 Other long term (current) drug therapy: Secondary | ICD-10-CM | POA: Diagnosis not present

## 2017-08-11 DIAGNOSIS — R7301 Impaired fasting glucose: Secondary | ICD-10-CM | POA: Diagnosis not present

## 2017-08-11 DIAGNOSIS — J449 Chronic obstructive pulmonary disease, unspecified: Secondary | ICD-10-CM | POA: Diagnosis not present

## 2017-08-11 DIAGNOSIS — F5101 Primary insomnia: Secondary | ICD-10-CM | POA: Diagnosis not present

## 2017-08-11 DIAGNOSIS — R011 Cardiac murmur, unspecified: Secondary | ICD-10-CM | POA: Diagnosis not present

## 2017-08-11 DIAGNOSIS — E559 Vitamin D deficiency, unspecified: Secondary | ICD-10-CM | POA: Diagnosis not present

## 2017-08-11 DIAGNOSIS — M542 Cervicalgia: Secondary | ICD-10-CM | POA: Diagnosis not present

## 2017-08-18 DIAGNOSIS — F5101 Primary insomnia: Secondary | ICD-10-CM | POA: Diagnosis not present

## 2017-08-18 DIAGNOSIS — M542 Cervicalgia: Secondary | ICD-10-CM | POA: Diagnosis not present

## 2017-08-18 DIAGNOSIS — Z0000 Encounter for general adult medical examination without abnormal findings: Secondary | ICD-10-CM | POA: Diagnosis not present

## 2017-08-18 DIAGNOSIS — F39 Unspecified mood [affective] disorder: Secondary | ICD-10-CM | POA: Diagnosis not present

## 2017-08-18 DIAGNOSIS — Z1151 Encounter for screening for human papillomavirus (HPV): Secondary | ICD-10-CM | POA: Diagnosis not present

## 2017-08-18 DIAGNOSIS — E559 Vitamin D deficiency, unspecified: Secondary | ICD-10-CM | POA: Diagnosis not present

## 2017-08-18 DIAGNOSIS — R011 Cardiac murmur, unspecified: Secondary | ICD-10-CM | POA: Diagnosis not present

## 2017-08-18 DIAGNOSIS — J449 Chronic obstructive pulmonary disease, unspecified: Secondary | ICD-10-CM | POA: Diagnosis not present

## 2017-08-18 DIAGNOSIS — Z124 Encounter for screening for malignant neoplasm of cervix: Secondary | ICD-10-CM | POA: Diagnosis not present

## 2017-09-19 DIAGNOSIS — G4700 Insomnia, unspecified: Secondary | ICD-10-CM | POA: Diagnosis not present

## 2017-09-19 DIAGNOSIS — F17200 Nicotine dependence, unspecified, uncomplicated: Secondary | ICD-10-CM | POA: Diagnosis not present

## 2017-09-19 DIAGNOSIS — J449 Chronic obstructive pulmonary disease, unspecified: Secondary | ICD-10-CM | POA: Diagnosis not present

## 2017-09-19 DIAGNOSIS — K210 Gastro-esophageal reflux disease with esophagitis: Secondary | ICD-10-CM | POA: Diagnosis not present

## 2017-09-19 DIAGNOSIS — Z6826 Body mass index (BMI) 26.0-26.9, adult: Secondary | ICD-10-CM | POA: Diagnosis not present

## 2017-10-04 DIAGNOSIS — J441 Chronic obstructive pulmonary disease with (acute) exacerbation: Secondary | ICD-10-CM | POA: Diagnosis not present

## 2017-10-04 DIAGNOSIS — Z6827 Body mass index (BMI) 27.0-27.9, adult: Secondary | ICD-10-CM | POA: Diagnosis not present

## 2017-10-31 DIAGNOSIS — Z6826 Body mass index (BMI) 26.0-26.9, adult: Secondary | ICD-10-CM | POA: Diagnosis not present

## 2017-10-31 DIAGNOSIS — H1045 Other chronic allergic conjunctivitis: Secondary | ICD-10-CM | POA: Diagnosis not present

## 2017-10-31 DIAGNOSIS — J449 Chronic obstructive pulmonary disease, unspecified: Secondary | ICD-10-CM | POA: Diagnosis not present

## 2017-10-31 DIAGNOSIS — K2970 Gastritis, unspecified, without bleeding: Secondary | ICD-10-CM | POA: Diagnosis not present

## 2019-02-13 IMAGING — DX DG CHEST 2V
2 series · 2 of 2 positions shown · non-contrast
Comparison: 12/04/2010

CLINICAL DATA: Left-sided chest pain for 2 weeks

EXAM:
CHEST - 2 VIEW

[chest pa]
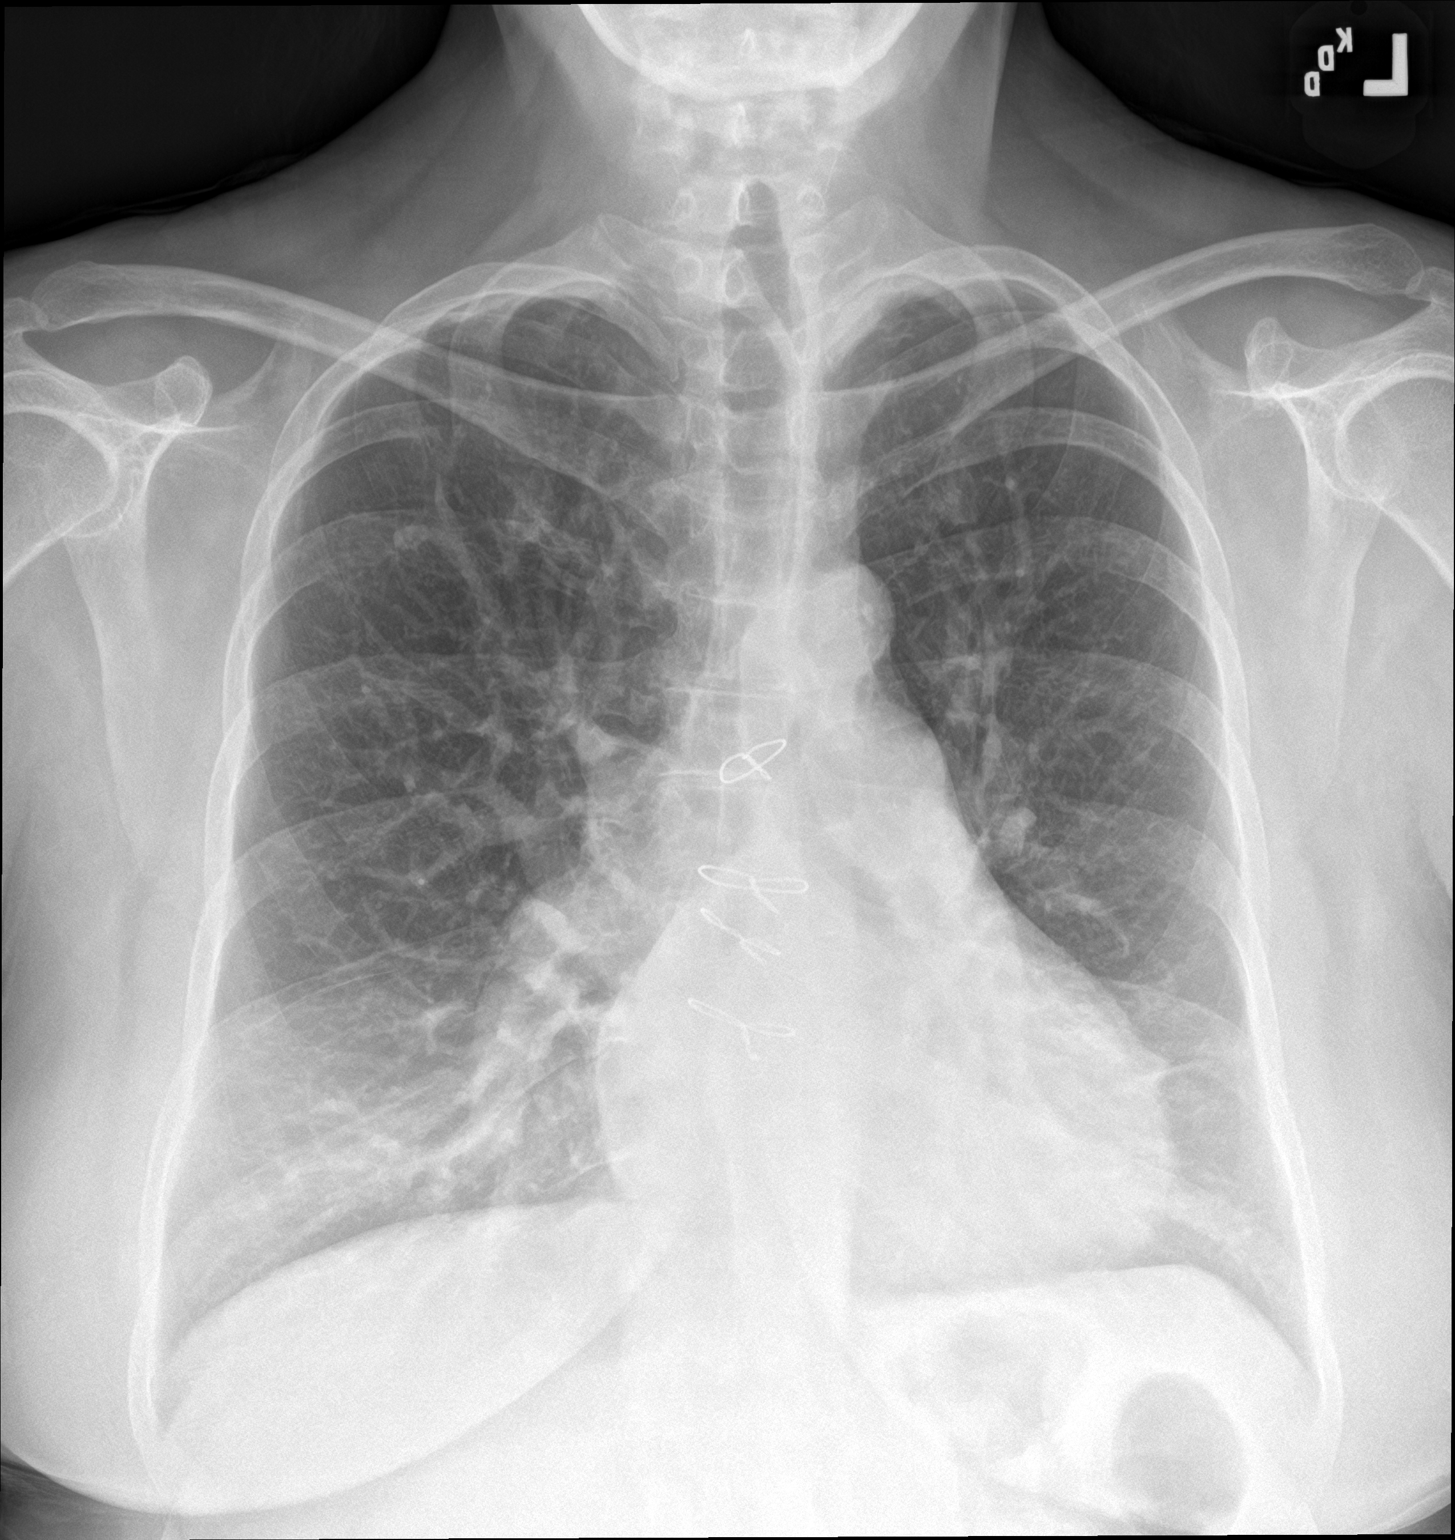

[chest lat]
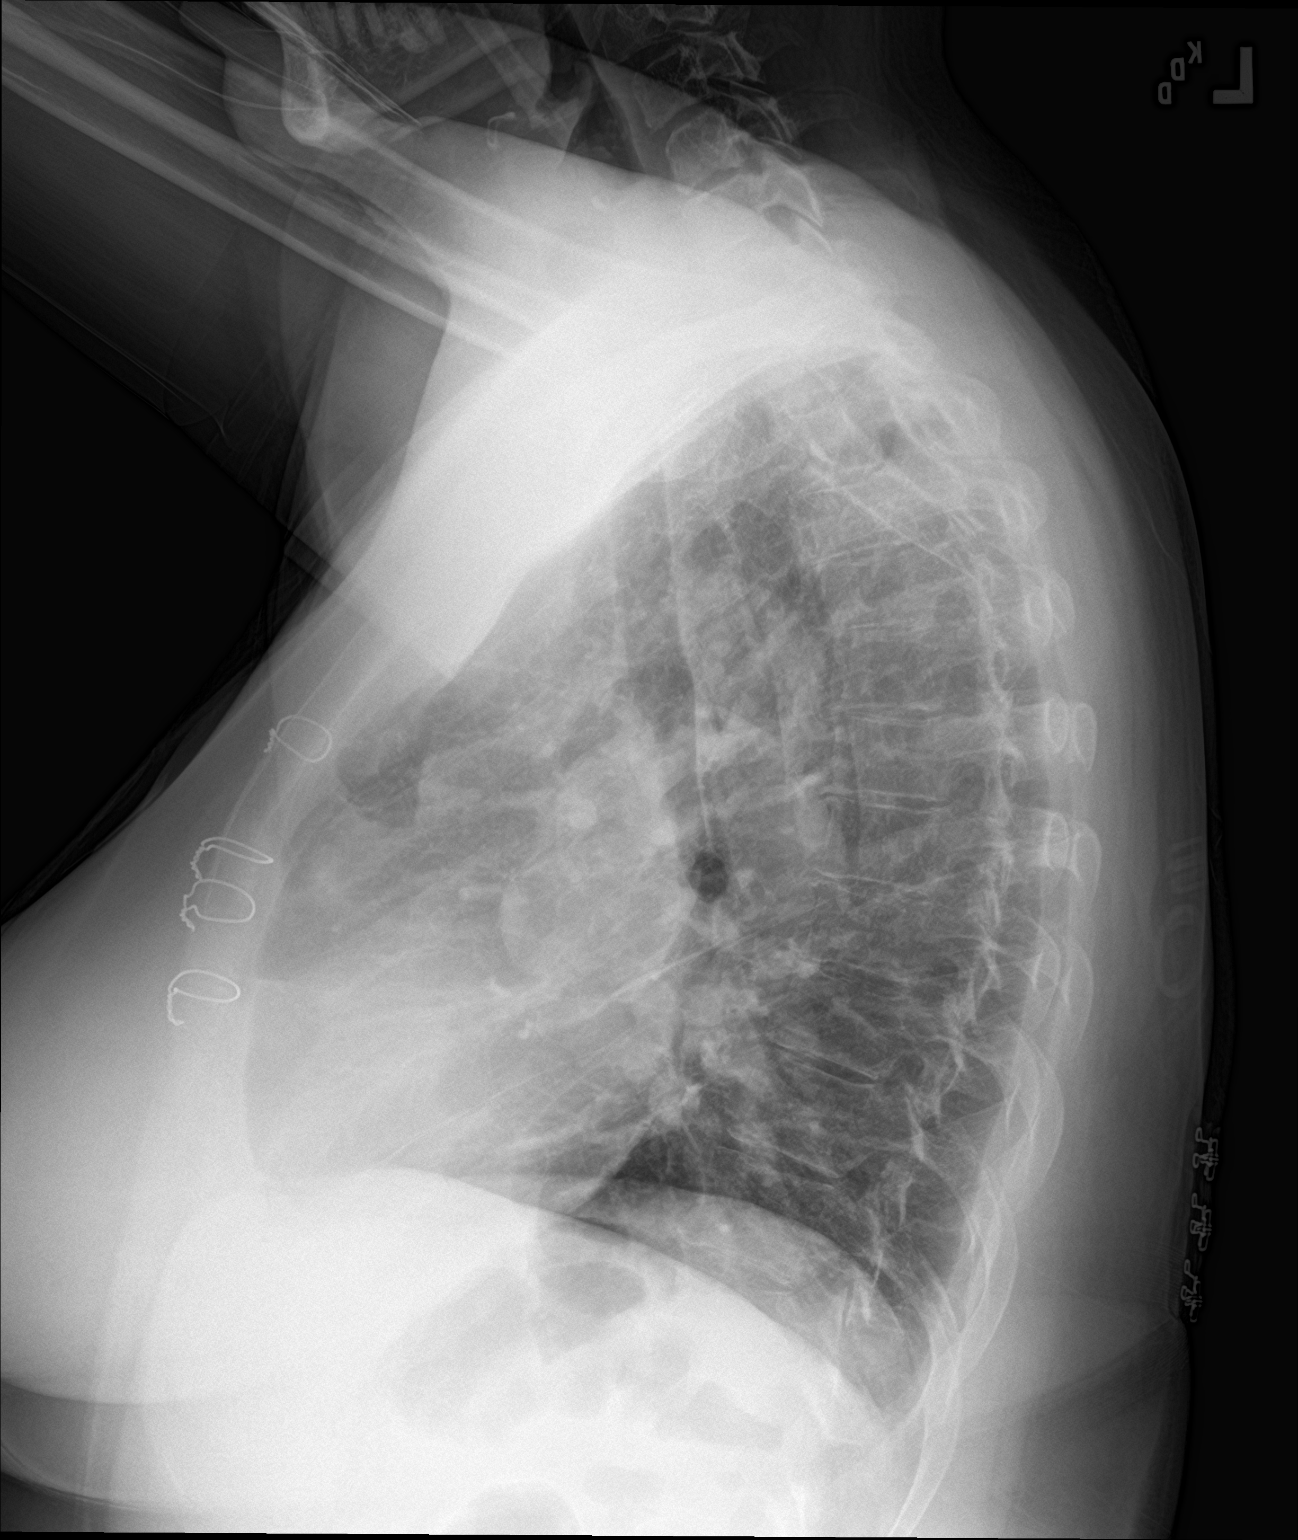

[2 of 2 positions shown; findings below may reference images not displayed]

FINDINGS: Postsurgical changes are again noted. Lungs are hyperinflated
without focal infiltrate or sizable effusion. Mild vascular
prominence is noted centrally without interstitial edema. No bony
abnormality is seen.
IMPRESSION: Mild vascular congestion without acute edema.

## 2019-02-16 DIAGNOSIS — Z20828 Contact with and (suspected) exposure to other viral communicable diseases: Secondary | ICD-10-CM | POA: Diagnosis not present

## 2022-09-23 DIAGNOSIS — J441 Chronic obstructive pulmonary disease with (acute) exacerbation: Secondary | ICD-10-CM | POA: Insufficient documentation

## 2022-09-23 DIAGNOSIS — F1021 Alcohol dependence, in remission: Secondary | ICD-10-CM | POA: Insufficient documentation

## 2022-11-17 DIAGNOSIS — I5020 Unspecified systolic (congestive) heart failure: Secondary | ICD-10-CM | POA: Insufficient documentation

## 2022-11-17 DIAGNOSIS — I509 Heart failure, unspecified: Secondary | ICD-10-CM | POA: Insufficient documentation

## 2022-12-23 ENCOUNTER — Ambulatory Visit (INDEPENDENT_AMBULATORY_CARE_PROVIDER_SITE_OTHER): Payer: Self-pay | Admitting: Internal Medicine

## 2022-12-23 ENCOUNTER — Encounter: Payer: Self-pay | Admitting: Internal Medicine

## 2022-12-23 VITALS — BP 112/68 | HR 88 | Ht 60.0 in | Wt 134.0 lb

## 2022-12-23 DIAGNOSIS — I2699 Other pulmonary embolism without acute cor pulmonale: Secondary | ICD-10-CM

## 2022-12-23 DIAGNOSIS — J449 Chronic obstructive pulmonary disease, unspecified: Secondary | ICD-10-CM | POA: Insufficient documentation

## 2022-12-23 DIAGNOSIS — F17210 Nicotine dependence, cigarettes, uncomplicated: Secondary | ICD-10-CM

## 2022-12-23 NOTE — Patient Instructions (Addendum)
Plan A = Automatic = Always=    Anoro or Trelegy 200 one click  each am :  really tug on it to get all the medication out then rinse and gargle.  Work on inhaler technique:  relax and gently blow all the way out then take a nice smooth full deep breath back in, triggering the inhaler at same time you start breathing in.  Hold breath in for at least  5 seconds if you can. Blow out trelegy  thru nose. Rinse and gargle with water when done.  If mouth or throat bother you at all,  try brushing teeth/gums/tongue with arm and hammer toothpaste/ make a slurry and gargle and spit out.      Plan B = Backup (to supplement plan A, not to replace it) Only use your albuterol inhaler as a rescue medication to be used if you can't catch your breath by resting or doing a relaxed purse lip breathing pattern.  - The less you use it, the better it will work when you need it. - Ok to use the inhaler up to 2 puffs  every 4 hours if you must but call for appointment if use goes up over your usual need - Don't leave home without it !!  (think of it like spare tire)  I will see what I can do to expedite cardiology follow up so we can get you on the right meds.  If condition worsens go to [facility] hosp   Please schedule a follow up office visit in 12 weeks, call sooner if needed with all medications /inhalers/ solutions in hand so we can verify exactly what you are taking. This includes all medications from all doctors and over the counters

## 2022-12-23 NOTE — Assessment & Plan Note (Signed)
Counseled re importance of smoking cessation but did not meet time criteria for separate billing           Each maintenance medication was reviewed in detail including emphasizing most importantly the difference between maintenance and prns and under what circumstances the prns are to be triggered using an action plan format where appropriate.  Total time for H and P, chart review, counseling, reviewing hfa/dpi device(s) and generating customized AVS unique to this office visit / same day charting = 45 min new pt eval

## 2022-12-23 NOTE — Progress Notes (Signed)
Alison Kennedy, female    DOB: 1965-04-03    MRN: [id number]   Brief patient profile:  25  yowf  active smoker  referred to pulmonary clinic in [place]  12/23/2022 by Alison Gibble NP  in [place]  for COPD f/u  arrived back in [place] Nov 2024   Spirometry 03/19/2011    - fev1 1.11L/46%, FVC 1.67L/58%, Ratio 67   History of Present Illness  12/23/2022  Pulmonary/ 1st office eval/ Alison Kennedy /  Office  Chief Complaint  Patient presents with   Establish Care   Shortness of Breath  Dyspnea:  food lion ok / no hc  Cough:  minimal dry  Sleep: bed is flat one pillow and awakens 3/7 nights due to cough  SABA use: avg 2 in 24 h  02: none  LDSCT:  CTa  11/24/22 neg nodules   No obvious day to day or daytime pattern/variability or assoc excess/ purulent sputum or mucus plugs or hemoptysis or cp or chest tightness, subjective wheeze or overt sinus or hb symptoms.    Also denies any obvious fluctuation of symptoms with weather or environmental changes or other aggravating or alleviating factors except as outlined above   No unusual exposure hx or h/o childhood pna/ asthma or knowledge of premature birth.  Current Allergies, Complete Past Medical History, Past Surgical History, Family History, and Social History were reviewed in [employer] record.  ROS  The following are not active complaints unless bolded Hoarseness, sore throat, dysphagia, dental problems, itching, sneezing,  nasal congestion or discharge of excess mucus or purulent secretions, ear ache,   fever, chills, sweats, unintended wt loss or wt gain, classically pleuritic or exertional cp,  orthopnea pnd or arm/hand swelling  or leg swelling, presyncope, palpitations, abdominal pain, anorexia, nausea, vomiting, diarrhea  or change in bowel habits or change in bladder habits, change in stools or change in urine, dysuria, hematuria,  rash, arthralgias, visual complaints, headache, numbness, weakness or  ataxia or problems with walking or coordination,  change in Kennedy or  memory.            Outpatient Medications Prior to Visit  Medication Sig Dispense Refill   ANORO ELLIPTA 62.5-25 MCG/ACT AEPB Inhale 1 puff into the lungs daily.     Calcium-Magnesium-Zinc 333-133-5 MG TABS Take by mouth.     furosemide (LASIX) 20 MG tablet Take 20 mg by mouth daily.     lisinopril (ZESTRIL) 2.5 MG tablet Take 1 tablet by mouth daily.     potassium chloride (MICRO-K) 10 MEQ CR capsule Take 10 mEq by mouth 2 (two) times daily.     albuterol (PROVENTIL HFA;VENTOLIN HFA) 108 (90 BASE) MCG/ACT inhaler Inhale 2 puffs into the lungs every 6 (six) hours as needed for wheezing or shortness of breath. 8.5 g 0   Multiple Vitamin (MULTI-VITAMIN) tablet Take 1 tablet by mouth daily. (Patient not taking: Reported on 12/23/2022)     ARIPiprazole (ABILIFY) 2 MG tablet Take 2 mg by mouth daily.       FLUoxetine (PROZAC) 40 MG capsule Take 40 mg by mouth daily.       gabapentin (NEURONTIN) 100 MG capsule Take 1 capsule (100 mg total) by mouth 3 (three) times daily. 90 capsule 0   ibuprofen (ADVIL,MOTRIN) 200 MG tablet Take 800 mg by mouth every 8 (eight) hours as needed.     zolpidem (AMBIEN) 10 MG tablet Take 10 mg by mouth at bedtime.  No facility-administered medications prior to visit.    Past Medical History:  Diagnosis Date   Alcohol abuse    Depression    Heart murmur       Objective:     BP 112/68   Pulse 88   Ht 5' (1.524 m)   Wt 134 lb (60.8 kg)   SpO2 93%   BMI 26.17 kg/m   SpO2: 93 % RA   Amb wf nad   HEENT : Oropharynx  clear/ poor dentition   Nasal turbinates nl    NECK :  without  apparent JVD/ palpable Nodes/TM    LUNGS: no acc muscle use,  Mild barrel  contour chest wall with bilateral  Distant bs s audible wheeze and  without cough on insp or exp maneuvers  and mild  Hyperresonant  to  percussion bilaterally     CV:  IRIR apical pulse around 60-80   no s3 or murmur or  increase in P2, and no edema   ABD:  soft and nontender with pos end  insp Hoover's  in the supine position.  No bruits or organomegaly appreciated   MS:  Nl gait/ ext warm without deformities Or obvious joint restrictions  calf tenderness, cyanosis or clubbing     SKIN: warm and dry without lesions    NEURO:  alert, approp, nl sensorium with  no motor or cerebellar deficits apparent.      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  CONSTELLATION OF FINDINGS SUSPICIOUS FOR CONGESTIVE HEART FAILURE. SMALL VOLUME OF ASCITES IN THE UPPER ABDOMEN.       Assessment   COPD GOLD 3 Active smoker - Spirometry 03/19/2011  fev1 1.11L/46%, FVC 1.67L/58%, Ratio 67 - 12/23/2022  After extensive coaching inhaler device,  effectiveness =    hfa 25% and DPI 50% so try trelegy 200 samples x 12 weeks while awaiting medicaid approval.   Group D (now reclassified as E) in terms of symptom/risk and laba/lama/ICS  therefore appropriate rx at this point >>>  trelegy 100 preferred but has no funds so given oversupply we have of the trelegy 200 with caveat needs to use arm and hammer toothpaste p use to prevent mouth/ throat irritation.     Pulmonary embolism (HCC) See CTa 11/14/22  non occlusive with neg L venous dopplers  In setting of chf/caf will likely need lifelong DOAC but has no funds / have messaged Alison Kennedy for advise on what to do awaiting 1st clinic opening but for now rate is well controlled and she is comfortable   For decompensation advised go to ER  at [facility] and can expedite benefits but no samples to offer here other than Trelegy (can do [employer] request on return if not benefits yet)               Cigarette smoker Counseled re importance of smoking cessation but did not meet time criteria for separate billing     Each  maintenance medication was reviewed in detail including emphasizing most importantly the difference between maintenance and prns and under what circumstances the prns are to be triggered using an action plan format where appropriate.  Total time for H and P, chart review, counseling, reviewing hfa/dpi device(s) and generating customized AVS unique to this office visit / same day charting = 45 min new pt eval  Sandrea Hughs, MD 12/23/2022

## 2022-12-23 NOTE — Assessment & Plan Note (Signed)
Active smoker - Spirometry 03/19/2011  fev1 1.11L/46%, FVC 1.67L/58%, Ratio 67 - 12/23/2022  After extensive coaching inhaler device,  effectiveness =    hfa 25% and DPI 50% so try trelegy 200 samples x 12 weeks while awaiting medicaid approval.   Group D (now reclassified as E) in terms of symptom/risk and laba/lama/ICS  therefore appropriate rx at this point >>>  trelegy 100 preferred but has no funds so given oversupply we have of the trelegy 200 with caveat needs to use arm and hammer toothpaste p use to prevent mouth/ throat irritation.

## 2022-12-23 NOTE — Assessment & Plan Note (Addendum)
See CTa 11/14/22  non occlusive with neg L venous dopplers  In setting of chf/caf will likely need lifelong DOAC but has no funds / have messaged Dr Wyline Mood for advise on what to do awaiting 1st clinic opening but for now rate is well controlled and she is comfortable   For decompensation advised go to ER  at [facility] and can expedite benefits but no samples to offer here other than Trelegy (can do [employer]o request on return if not benefits yet)

## 2023-01-13 ENCOUNTER — Institutional Professional Consult (permissible substitution): Payer: BLUE CROSS/BLUE SHIELD | Admitting: Internal Medicine

## 2023-03-13 NOTE — Progress Notes (Unsigned)
 Alison Kennedy, female    DOB: 1965/05/27    MRN: [id number]   Brief patient profile:  10  yowf  active smoker  referred to pulmonary clinic in [place]  12/23/2022 by Alison Gibble NP  in [place]  for COPD f/u  arrived back in [place] Nov 2024   Spirometry 03/19/2011    - fev1 1.11L/46%, FVC 1.67L/58%, Ratio 67   History of Present Illness  12/23/2022  Pulmonary/ 1st office eval/ Alison Kennedy / [place] [facility]  Chief Complaint  Patient presents with   Establish Care   Shortness of Breath  Dyspnea:  food lion ok / no hc  Cough:  minimal dry  Sleep: bed is flat one pillow and awakens 3/7 nights due to cough  SABA use: avg 2 in 24 h  02: none  LDSCT:  CTa  11/24/22 neg nodules  Rec Plan A = Automatic = Always=    Anoro or Trelegy 200 one click  each am :  really tug on it to get all the medication out then rinse and gargle. Work on inhaler technique: Plan B = Backup (to supplement plan A, not to replace it) Only use your albuterol inhaler as a rescue medication I will see what I can do to expedite cardiology follow up so we can get you on the right meds.  If condition worsens go to [facility]   Please schedule a follow up office visit in 12 weeks, call sooner if needed with all medications /inhalers/ solutions in hand    03/14/2023  f/u ov/[name] [facility]/Alison Kennedy re: GOLD 2  maint on trelegy 200  did not  bring meds / on ACEi  Chief Complaint  Patient presents with   Follow-up    Follow up , having some light headiness   Dyspnea:  can do food lion slow pace  Cough: worse p supper non productive  Sleeping: flat bed one pillow  wakes once q night  with cough close to bedtime  SABA use: once a day s pattern  02: none   Lung cancer screening: due 11/2023    No obvious day to day or daytime variability or assoc excess/ purulent sputum or mucus plugs or hemoptysis or cp or chest tightness, subjective wheeze or overt sinus or hb symptoms.    Also denies any obvious  fluctuation of symptoms with weather or environmental changes or other aggravating or alleviating factors except as outlined above   No unusual exposure hx or h/o childhood pna/ asthma or knowledge of premature birth.  Current Allergies, Complete Past Medical History, Past Surgical History, Family History, and Social History were reviewed in [employer] record.  ROS  The following are not active complaints unless bolded Hoarseness, sore throat, dysphagia, dental problems, itching, sneezing,  nasal congestion or discharge of excess mucus or purulent secretions, ear ache,   fever, chills, sweats, unintended wt loss or wt gain, classically pleuritic or exertional cp,  orthopnea pnd or arm/hand swelling  or leg swelling, presyncope, palpitations, abdominal pain, anorexia, nausea, vomiting, diarrhea  or change in bowel habits or change in bladder habits, change in stools or change in urine, dysuria, hematuria,  rash, arthralgias, visual complaints, headache, numbness, weakness or ataxia or problems with walking or coordination,  change in mood or  memory.        Current Meds  Medication Sig   ANORO ELLIPTA 62.5-25 MCG/ACT AEPB Inhale 1 puff into the lungs daily.   Calcium-Magnesium-Zinc 333-133-5 MG TABS Take  by mouth.   furosemide (LASIX) 20 MG tablet Take 20 mg by mouth daily.   lisinopril (ZESTRIL) 2.5 MG tablet Take 1 tablet by mouth daily.   Multiple Vitamin (MULTI-VITAMIN) tablet Take 1 tablet by mouth daily.   potassium chloride (MICRO-K) 10 MEQ CR capsule Take 10 mEq by mouth 2 (two) times daily.            Past Medical History:  Diagnosis Date   Alcohol abuse    Depression    Heart murmur       Objective:     Wt Readings from Last 3 Encounters:  03/14/23 133 lb (60.3 kg)  12/23/22 134 lb (60.8 kg)  07/03/17 138 lb (62.6 kg)      Vital signs reviewed  03/14/2023  - Note at rest 02 sats  94% on RA   General appearance:    Somber amb wf/ smoker's  rattle     HEENT : Oropharynx  clear/ poor dentition     NECK :  without  apparent JVD/ palpable Nodes/TM / prominent pseudowheeze   LUNGS: no acc muscle use,  Mild barrel  contour chest wall with bilateral  Distant bs s audible wheeze and  without cough on insp or exp maneuvers  and mild  Hyperresonant  to  percussion bilaterally     CV:  RRR  no s3 or murmur or increase in P2, and no edema   ABD:  soft and nontender with pos end  insp Hoover's  in the supine position.  No bruits or organomegaly appreciated   MS:  Nl gait/ ext warm without deformities Or obvious joint restrictions  calf tenderness, cyanosis or clubbing     SKIN: warm and dry without lesions    NEURO:  alert, approp, nl sensorium with  no motor or cerebellar deficits apparent.      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  CONSTELLATION OF FINDINGS SUSPICIOUS FOR CONGESTIVE HEART FAILURE. SMALL VOLUME OF ASCITES IN THE UPPER ABDOMEN.       Assessment

## 2023-03-14 ENCOUNTER — Encounter: Payer: Self-pay | Admitting: Internal Medicine

## 2023-03-14 ENCOUNTER — Ambulatory Visit: Payer: Self-pay | Admitting: Internal Medicine

## 2023-03-14 VITALS — BP 126/81 | HR 88 | Ht 61.0 in | Wt 133.0 lb

## 2023-03-14 DIAGNOSIS — J449 Chronic obstructive pulmonary disease, unspecified: Secondary | ICD-10-CM

## 2023-03-14 DIAGNOSIS — F17210 Nicotine dependence, cigarettes, uncomplicated: Secondary | ICD-10-CM | POA: Diagnosis not present

## 2023-03-14 DIAGNOSIS — I5020 Unspecified systolic (congestive) heart failure: Secondary | ICD-10-CM

## 2023-03-14 MED ORDER — TRELEGY ELLIPTA 100-62.5-25 MCG/ACT IN AEPB: 1.0000 | INHALATION_SPRAY | Freq: Every morning | RESPIRATORY_TRACT | Status: DC

## 2023-03-14 MED ORDER — VALSARTAN 40 MG PO TABS: 40.0000 mg | ORAL_TABLET | Freq: Every day | ORAL | 11 refills | Status: DC

## 2023-03-14 MED ORDER — TRELEGY ELLIPTA 100-62.5-25 MCG/ACT IN AEPB: INHALATION_SPRAY | RESPIRATORY_TRACT | 11 refills | Status: DC

## 2023-03-14 NOTE — Patient Instructions (Signed)
 Stop lisinopril and replace with valsartan 40 mg one daily   The key is to stop smoking completely before smoking completely stops you!    Change to Trelegy 100 one click each am    Please schedule a follow up visit in 3 months but call sooner if needed

## 2023-03-15 NOTE — Assessment & Plan Note (Addendum)
 Active smoker - Spirometry 03/19/2011  fev1 1.11L/46%, FVC 1.67L/58%, Ratio 67 - 12/23/2022  After extensive coaching inhaler device,  effectiveness =    hfa 25% and DPI 50% so try trelegy 200 samples x 12 weeks while awaiting medicaid approval.  - 03/14/2023 change to trelegy 100 each am and try off acei    Group D (now reclassified as E) in terms of symptom/risk and laba/lama/ICS  therefore appropriate rx at this point >>>  trelegy plus approp saba

## 2023-03-15 NOTE — Assessment & Plan Note (Addendum)
-   d/c ACEi 03/14/2023 due to pseudowheeze > rx valsartan 40 mg and f/u with Cards as planned  ACE inhibitors are problematic in  pts with airway complaints because  even experienced pulmonologists can't always distinguish ace effects from copd/asthma.  By themselves they don't actually cause a problem, much like oxygen can't by itself start a fire, but they certainly serve as a powerful catalyst or enhancer for any "fire"  or inflammatory process in the upper airway, be it caused by an ET  tube or more commonly reflux (especially in the obese or pts with known GERD or who are on biphoshonates).    In the era of ARB near equivalency until we have a better handle on the reversibility of the airway problem, it just makes sense to avoid ACEI  entirely in the short run and then decide later, having established a level of airway control using a reasonable limited regimen, whether to add back ace but even then being very careful to observe the pt for worsening airway control and number of meds used/ needed to control symptoms.    >>> try valsartan as above         Each maintenance medication was reviewed in detail including emphasizing most importantly the difference between maintenance and prns and under what circumstances the prns are to be triggered using an action plan format where appropriate.  Total time for H and P, chart review, counseling, reviewing dpi/hfa  device(s) and generating customized AVS unique to this office visit / same day charting = 30 min

## 2023-03-15 NOTE — Assessment & Plan Note (Signed)

## 2023-03-16 ENCOUNTER — Telehealth: Payer: Self-pay | Admitting: Internal Medicine

## 2023-03-16 NOTE — Telephone Encounter (Signed)
What medication is this referencing? 

## 2023-03-16 NOTE — Telephone Encounter (Signed)
 My apologies. Trelegy.

## 2023-03-16 NOTE — Telephone Encounter (Signed)
 PT calling because Dr. Sherene Sires changed her medication and the Pharmacy gave it to her but they said they'd need a PA. Please call PT to advise action taken. [phone]

## 2023-03-16 NOTE — Telephone Encounter (Signed)
 Correction- They did not give her the meds and asked if she could have the PA done today so she can get this medication for 4.00.

## 2023-03-17 ENCOUNTER — Telehealth: Payer: Self-pay

## 2023-03-17 ENCOUNTER — Other Ambulatory Visit (HOSPITAL_COMMUNITY): Payer: Self-pay

## 2023-03-17 NOTE — Telephone Encounter (Signed)
 PA request has been Submitted. New Encounter has been or will be created for follow up. For additional info see Pharmacy Prior Auth telephone encounter from 03/13.

## 2023-03-17 NOTE — Telephone Encounter (Signed)
*  Pulm  Pharmacy Patient Advocate Encounter   Received notification from Pt Calls Messages that prior authorization for Trelegy Ellipta 100-62.5-25MCG/ACT aerosol powder  is required/requested.   Insurance verification completed.   The patient is insured through [facility] .   Per test claim: PA required; PA submitted to above mentioned insurance via CoverMyMeds Key/confirmation #/EOC [id number] Status is pending

## 2023-03-17 NOTE — Telephone Encounter (Signed)
 Rachel Bo from [facility] states Trelegy needs prior authorization. Rachel Bo phone number is [phone].

## 2023-03-18 NOTE — Telephone Encounter (Signed)
 Pharmacy Patient Advocate Encounter  Received notification from [facility] that Prior Authorization for Trelegy Ellipta has been DENIED.  Full denial letter will be uploaded to the media tab. See denial reason below.   Per your health plan's criteria, this drug is covered if you meet the following: One of the following:  (1) You have failed at least two preferred drugs as confirmed by claims history or submission of medical records. The preferred drugs: brand Symbicort, brand Advair Diskus, Dulera.  (2) You cannot use two preferred drugs (please specify contraindication or intolerance). The information provided does not show that you meet the criteria listed above. Please speak with your doctor about your choices

## 2023-03-21 ENCOUNTER — Telehealth: Payer: Self-pay | Admitting: Internal Medicine

## 2023-03-21 NOTE — Telephone Encounter (Signed)
 Dr Sherene Sires, please advise on alternative for trelegy. Her insurance denied coverage. Thanks!  Per your health plan's criteria, this drug is covered if you meet the following: One of the following:  (1) You have failed at least two preferred drugs as confirmed by claims history or submission of medical records. The preferred drugs: brand Symbicort, brand Advair Diskus, Dulera.  (2) You cannot use two preferred drugs (please specify contraindication or intolerance). The information provided does not show that you meet the criteria listed above. Please speak with your doctor about your choices

## 2023-03-21 NOTE — Telephone Encounter (Signed)
 See phone encounter from 03/17/23

## 2023-03-21 NOTE — Telephone Encounter (Signed)
 Ins Co calling on behalf of PT with the PT on the line. With regard to Trellegy (See PA tel encounters below) The Ins Rep said we need to change the [facility] Code so the PT can get the "generic of Trelegy" which she says her information indicates  is Fluticason.  She was arguing with a Pharmacist who said there is no alternative to Trelegy but that is not the information she has. I asked for her CB # and name and she said to call the PT that she was just trying to help her. PT's number is [phone]

## 2023-03-22 NOTE — Telephone Encounter (Signed)
 Nyoka Cowden, MD to Me     03/21/23 12:06 PM Advair 250 discuss one  bid  Called the pt and there was no answer and her vm was full- will call back

## 2023-03-25 MED ORDER — FLUTICASONE-SALMETEROL 250-50 MCG/ACT IN AEPB: 1.0000 | INHALATION_SPRAY | Freq: Two times a day (BID) | RESPIRATORY_TRACT | 11 refills | Status: DC

## 2023-03-25 NOTE — Addendum Note (Signed)
 Addended by: Christen Butter on: 03/25/2023 02:02 PM   Modules accepted: Orders

## 2023-03-25 NOTE — Telephone Encounter (Signed)
 I called and spoke with the pt and notified her of recommendations from Dr Sherene Sires  She verbalized understanding  MAR updated  Rx for advair was sent  Nothing further needed

## 2023-03-30 NOTE — Telephone Encounter (Signed)
 NFN

## 2023-04-04 ENCOUNTER — Ambulatory Visit: Payer: Self-pay | Admitting: Internal Medicine

## 2023-04-06 ENCOUNTER — Encounter: Payer: Self-pay | Admitting: Internal Medicine

## 2023-04-06 ENCOUNTER — Ambulatory Visit: Payer: Self-pay | Attending: Internal Medicine | Admitting: Internal Medicine

## 2023-04-06 VITALS — BP 108/72 | HR 74 | Ht 60.0 in | Wt 137.0 lb

## 2023-04-06 DIAGNOSIS — I5020 Unspecified systolic (congestive) heart failure: Secondary | ICD-10-CM

## 2023-04-06 DIAGNOSIS — I509 Heart failure, unspecified: Secondary | ICD-10-CM

## 2023-04-06 MED ORDER — FUROSEMIDE 20 MG PO TABS: 20.0000 mg | ORAL_TABLET | Freq: Every day | ORAL | 3 refills | Status: DC

## 2023-04-06 NOTE — Patient Instructions (Signed)
 Medication Instructions:  Your physician recommends that you continue on your current medications as directed. Please refer to the Current Medication list given to you today.  *If you need a refill on your cardiac medications before your next appointment, please call your pharmacy*  Lab Work: None If you have labs (blood work) drawn today and your tests are completely normal, you will receive your results only by: MyChart Message (if you have MyChart) OR A paper copy in the mail If you have any lab test that is abnormal or we need to change your treatment, we will call you to review the results.  Testing/Procedures: Your physician has requested that you have an echocardiogram. Echocardiography is a painless test that uses sound waves to create images of your heart. It provides your doctor with information about the size and shape of your heart and how well your heart's chambers and valves are working. This procedure takes approximately one hour. There are no restrictions for this procedure. Please do NOT wear cologne, perfume, aftershave, or lotions (deodorant is allowed). Please arrive 15 minutes prior to your appointment time.  Please note: We ask at that you not bring children with you during ultrasound (echo/ vascular) testing. Due to room size and safety concerns, children are not allowed in the ultrasound rooms during exams. Our front office staff cannot provide observation of children in our lobby area while testing is being conducted. An adult accompanying a patient to their appointment will only be allowed in the ultrasound room at the discretion of the ultrasound technician under special circumstances. We apologize for any inconvenience.   Follow-Up: At [facility], you and your health needs are our priority.  As part of our continuing mission to provide you with exceptional heart care, our providers are all part of one team.  This team includes your primary Cardiologist  (physician) and Advanced Practice Providers or APPs (Physician Assistants and Nurse Practitioners) who all work together to provide you with the care you need, when you need it.  Your next appointment:   1 month(s)  Provider:   You may see Luane School, MD or one of the following Advanced Practice Providers on your designated Care Team:   [place], PA-C  [place], [place] Jacolyn Reedy, [place]     We recommend signing up for the patient portal called "MyChart".  Sign up information is provided on this After Visit Summary.  MyChart is used to connect with patients for Virtual Visits (Telemedicine).  Patients are able to view lab/test results, encounter notes, upcoming appointments, etc.  Non-urgent messages can be sent to your provider as well.   To learn more about what you can do with MyChart, go to [email or web address].   Other Instructions

## 2023-04-06 NOTE — Progress Notes (Signed)
 Cardiology Office Note  Date: 04/06/2023   ID: Alison Kennedy, DOB 05-16-1965, MRN [id number]  PCP:  Rica Records, FNP  Cardiologist:  None Electrophysiologist:  None   History of Present Illness: Alison Kennedy is a 58 y.o. female known to have congestive heart failure was referred to cardiology clinic to establish care.  Patient had ER visit in November 2024 for symptoms of DOE and bilateral lower extremity swelling.  Her BNP was significantly elevated, 4k.  She was diuresed and discharged on diuretics.  Her symptoms improved but she reports having fluctuating symptoms of shortness of breath.  No leg swelling.  No angina.  No dizziness, syncope, palpitations.  Echocardiogram not performed till date.  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 8.5 g 0   Calcium-Magnesium-Zinc 333-133-5 MG TABS Take by mouth.     fluticasone-salmeterol (ADVAIR) 250-50 MCG/ACT AEPB Inhale 1 puff into the lungs every 12 (twelve) hours. 60 each 11   Multiple Vitamin (MULTI-VITAMIN) tablet Take 1 tablet by mouth daily.     valsartan (DIOVAN) 40 MG tablet Take 1 tablet (40 mg total) by mouth daily. 30 tablet 11   furosemide (LASIX) 20 MG tablet Take 20 mg by mouth daily. (Patient not taking: Reported on 04/06/2023)     potassium chloride (MICRO-K) 10 MEQ CR capsule Take 10 mEq by mouth 2 (two) times daily. (Patient not taking: Reported on 04/06/2023)     No current facility-administered medications for this visit.   Allergies:  Patient has no known allergies.   Social History: The patient   reports that she has been smoking cigarettes. She has a 60 pack-year smoking history. She has never used smokeless tobacco. She reports current alcohol use. She reports that she does not use drugs.   Family History: The patient's family history includes Asthma in her brother and son; COPD in her maternal grandfather; Heart disease in her maternal grandfather.   ROS:  Please see the history of present illness. Otherwise, complete review of systems is positive for none.  All other systems are reviewed and negative.   Physical Exam: VS:  Ht 5' (1.524 m)   Wt 137 lb (62.1 kg)   BMI 26.76 kg/m , BMI Body mass index is 26.76 kg/m.  Wt Readings from Last 3 Encounters:  04/06/23 137 lb (62.1 kg)  03/14/23 133 lb (60.3 kg)  12/23/22 134 lb (60.8 kg)    General: Patient appears comfortable at rest. HEENT: Conjunctiva and lids normal, oropharynx clear with moist mucosa. Neck: Supple, no elevated JVP or carotid bruits, no thyromegaly. Lungs: Clear to auscultation, nonlabored breathing at rest. Cardiac: Regular rate and rhythm, no S3 or significant systolic murmur, no pericardial rub. Abdomen: Soft, nontender, no hepatomegaly, bowel sounds present, no guarding or rebound. Extremities: No pitting edema, distal pulses 2+. Skin: Warm and dry. Musculoskeletal: No kyphosis. Neuropsychiatric: Alert and oriented x3, affect grossly appropriate.  Recent Labwork: No results found for requested labs within last 365 days.  No results found for: "CHOL", "TRIG", "HDL", "CHOLHDL", "VLDL", "  LDLCALC", "LDLDIRECT"   Assessment and Plan:  History of congestive heart failure in November 2024: Presented to [facility] with symptoms of DOE and bilateral lower EXTR swelling. BNP 4k.  Upon discharge, DOE improved but she continues to have fluctuating DOE.  Currently on Lasix 20 mg once daily which I will continue.  Obtain 2D echocardiogram.  No angina.  Nicotine abuse: Current smoker.  Counseling  provided.    Medication Adjustments/Labs and Tests Ordered: Current medicines are reviewed at length with the patient today.  Concerns regarding medicines are outlined above.    Disposition:  Follow up 1 month  Signed, Tajana Crotteau Verne Spurr, MD, 04/06/2023 1:54 PM    [facility] [address] [place], [place] [address]

## 2023-04-08 ENCOUNTER — Other Ambulatory Visit: Payer: Self-pay | Admitting: Internal Medicine

## 2023-04-08 MED ORDER — ALBUTEROL SULFATE HFA 108 (90 BASE) MCG/ACT IN AERS: 2.0000 | INHALATION_SPRAY | Freq: Four times a day (QID) | RESPIRATORY_TRACT | 1 refills | Status: DC | PRN

## 2023-04-08 NOTE — Telephone Encounter (Signed)
 Copied from CRM [id number]. Topic: Clinical - Medication Refill >> Apr 08, 2023  9:11 AM Theodis Sato wrote: Most Recent Primary Care Visit:   Medication: albuterol (PROVENTIL HFA;VENTOLIN HFA) 108 (90 BASE) MCG/ACT inhaler  Has the patient contacted their pharmacy? No, prescription ended.  (Agent: If no, request that the patient contact the pharmacy for the refill. If patient does not wish to contact the pharmacy document the reason why and proceed with request.) (Agent: If yes, when and what did the pharmacy advise?)  Is this the correct pharmacy for this prescription? Yes If no, delete pharmacy and type the correct one.  This is the patient's preferred pharmacy:  [facility] [address] [place] - [address] [place] [address] [place] [address] Phone: [phone] Fax: [phone]   Has the prescription been filled recently? Yes  Is the patient out of the medication? Yes  Has the patient been seen for an appointment in the last year OR does the patient have an upcoming appointment? Yes  Can we respond through MyChart? No  Agent: Please be advised that Rx refills may take up to 3 business days. We ask that you follow-up with your pharmacy.

## 2023-04-18 ENCOUNTER — Ambulatory Visit (HOSPITAL_COMMUNITY)
Admission: RE | Admit: 2023-04-18 | Discharge: 2023-04-18 | Disposition: A | Discharge status: Home / Self Care | Source: Ambulatory Visit | Attending: Internal Medicine | Admitting: Internal Medicine

## 2023-04-18 DIAGNOSIS — I5020 Unspecified systolic (congestive) heart failure: Secondary | ICD-10-CM | POA: Diagnosis present

## 2023-04-18 DIAGNOSIS — I509 Heart failure, unspecified: Secondary | ICD-10-CM

## 2023-04-18 LAB — ECHOCARDIOGRAM COMPLETE
Area-P 1/2: 3.91 cm2
S' Lateral: 4.5 cm

## 2023-04-18 NOTE — Progress Notes (Signed)
*  PRELIMINARY RESULTS* Echocardiogram 2D Echocardiogram has been performed.  Alison Kennedy 04/18/2023, 9:25 AM

## 2023-04-21 ENCOUNTER — Encounter: Payer: Self-pay | Admitting: Family Medicine

## 2023-04-21 ENCOUNTER — Ambulatory Visit (INDEPENDENT_AMBULATORY_CARE_PROVIDER_SITE_OTHER): Payer: Self-pay | Admitting: Family Medicine

## 2023-04-21 VITALS — BP 112/65 | HR 75 | Ht 60.0 in | Wt 136.0 lb

## 2023-04-21 DIAGNOSIS — Z114 Encounter for screening for human immunodeficiency virus [HIV]: Secondary | ICD-10-CM

## 2023-04-21 DIAGNOSIS — R7301 Impaired fasting glucose: Secondary | ICD-10-CM | POA: Diagnosis not present

## 2023-04-21 DIAGNOSIS — Z122 Encounter for screening for malignant neoplasm of respiratory organs: Secondary | ICD-10-CM

## 2023-04-21 DIAGNOSIS — H9190 Unspecified hearing loss, unspecified ear: Secondary | ICD-10-CM

## 2023-04-21 DIAGNOSIS — Z0001 Encounter for general adult medical examination with abnormal findings: Secondary | ICD-10-CM

## 2023-04-21 DIAGNOSIS — E038 Other specified hypothyroidism: Secondary | ICD-10-CM

## 2023-04-21 DIAGNOSIS — Z1159 Encounter for screening for other viral diseases: Secondary | ICD-10-CM | POA: Diagnosis not present

## 2023-04-21 DIAGNOSIS — E559 Vitamin D deficiency, unspecified: Secondary | ICD-10-CM | POA: Diagnosis not present

## 2023-04-21 DIAGNOSIS — Z1211 Encounter for screening for malignant neoplasm of colon: Secondary | ICD-10-CM

## 2023-04-21 DIAGNOSIS — G479 Sleep disorder, unspecified: Secondary | ICD-10-CM | POA: Diagnosis not present

## 2023-04-21 DIAGNOSIS — Z136 Encounter for screening for cardiovascular disorders: Secondary | ICD-10-CM

## 2023-04-21 DIAGNOSIS — Z1231 Encounter for screening mammogram for malignant neoplasm of breast: Secondary | ICD-10-CM

## 2023-04-21 MED ORDER — TRAZODONE HCL 50 MG PO TABS: 50.0000 mg | ORAL_TABLET | Freq: Every evening | ORAL | 3 refills | Status: DC | PRN

## 2023-04-21 NOTE — Assessment & Plan Note (Signed)

## 2023-04-21 NOTE — Patient Instructions (Signed)

## 2023-04-21 NOTE — Assessment & Plan Note (Signed)
 Trial on Trazodone 50 mg at bedtime PRN  Explained to go to bed at the same time each night and get up at the same time each morning, including on the weekends. Make sure your bedroom is quiet, dark, relaxing, and at a comfortable temperature. Remove electronic devices, such as TVs, computers, and smart phones, from the bedroom.

## 2023-04-21 NOTE — Progress Notes (Signed)
 Complete physical exam  Patient: Alison Kennedy   DOB: 1965-10-05   58 y.o. Female  MRN: [id number]  Subjective:    Chief Complaint  Patient presents with   Establish Care    Physical     Aeva C Mcmasters is a 58 y.o. female who presents today for a complete physical exam. She reports consuming a general diet. The patient does not participate in regular exercise at present. She generally feels well. She reports sleeping fair, sleeps around 6 hour per night. She does have additional problems to discuss today.    Most recent fall risk assessment:    04/21/2023    1:25 PM  Fall Risk   Falls in the past year? 0  Number falls in past yr: 0  Injury with Fall? 0  Risk for fall due to : No Fall Risks  Follow up Falls evaluation completed     Most recent depression screenings:    04/21/2023    1:25 PM  PHQ 2/9 Scores  PHQ - 2 Score 2  PHQ- 9 Score 5    Vision:Not within last year  and Dental: No current dental problems and No regular dental care   Patient Care Team: Del Newman Nip, Tenna Child, FNP as PCP - General (Family Medicine) Nyoka Cowden, MD as Consulting Physician (Pulmonary Disease)   Outpatient Medications Prior to Visit  Medication Sig   albuterol (VENTOLIN HFA) 108 (90 Base) MCG/ACT inhaler Inhale 2 puffs into the lungs every 6 (six) hours as needed for wheezing or shortness of breath.   Calcium-Magnesium-Zinc 333-133-5 MG TABS Take by mouth.   fluticasone-salmeterol (ADVAIR) 250-50 MCG/ACT AEPB Inhale 1 puff into the lungs every 12 (twelve) hours.   furosemide (LASIX) 20 MG tablet Take 1 tablet (20 mg total) by mouth daily.   Multiple Vitamin (MULTI-VITAMIN) tablet Take 1 tablet by mouth daily.   potassium chloride (MICRO-K) 10 MEQ CR capsule Take 10 mEq by mouth 2 (two) times daily.   valsartan (DIOVAN) 40 MG tablet Take 1 tablet (40 mg total) by mouth daily.   No facility-administered medications prior to visit.    Review of Systems  Constitutional:   Negative for chills and fever.  Eyes:  Negative for blurred vision.  Respiratory:  Positive for shortness of breath.   Cardiovascular:  Negative for chest pain.  Gastrointestinal:  Negative for abdominal pain.  Genitourinary:  Negative for dysuria.  Musculoskeletal:  Positive for back pain and myalgias.  Skin:  Negative for rash.  Neurological:  Negative for dizziness and headaches.       Objective:    BP 112/65   Pulse 75   Ht 5' (1.524 m)   Wt 136 lb 0.6 oz (61.7 kg)   SpO2 93%   BMI 26.57 kg/m  BP Readings from Last 3 Encounters:  04/21/23 112/65  04/06/23 108/72  03/14/23 126/81      Physical Exam Vitals reviewed.  Constitutional:      General: She is not in acute distress.    Appearance: Normal appearance. She is not ill-appearing, toxic-appearing or diaphoretic.  HENT:     Head: Normocephalic.     Right Ear: Tympanic membrane normal.     Left Ear: Tympanic membrane normal.     Mouth/Throat:     Mouth: Mucous membranes are moist.  Eyes:     General:        Right eye: No discharge.        Left eye: No discharge.  Conjunctiva/sclera: Conjunctivae normal.     Pupils: Pupils are equal, round, and reactive to light.  Cardiovascular:     Rate and Rhythm: Normal rate.     Pulses: Normal pulses.     Heart sounds: Normal heart sounds.  Pulmonary:     Effort: Pulmonary effort is normal. No respiratory distress.     Breath sounds: Normal breath sounds.  Abdominal:     General: Bowel sounds are normal.     Palpations: Abdomen is soft.     Tenderness: There is no abdominal tenderness. There is no right CVA tenderness, left CVA tenderness or guarding.  Musculoskeletal:     Thoracic back: Decreased range of motion.     Lumbar back: Decreased range of motion.  Skin:    General: Skin is warm and dry.     Capillary Refill: Capillary refill takes less than 2 seconds.  Neurological:     Mental Status: She is alert.     Coordination: Coordination normal.     Gait:  Gait normal.  Psychiatric:        Mood and Affect: Mood normal.        Behavior: Behavior normal.      No results found for any visits on 04/21/23.    Assessment & Plan:    Routine Health Maintenance and Physical Exam  Immunization History  Administered Date(s) Administered   MMR 11/21/2006   Tdap 11/16/2006    Health Maintenance  Topic Date Due   HIV Screening  Never done   Hepatitis C Screening  Never done   Pneumococcal Vaccine 19-76 Years old (1 of 2 - PCV) Never done   Colonoscopy  Never done   Lung Cancer Screening  Never done   MAMMOGRAM  01/19/2014   Zoster Vaccines- Shingrix (1 of 2) Never done   DTaP/Tdap/Td (2 - Td or Tdap) 11/15/2016   COVID-19 Vaccine (1 - 2024-25 season) 05/07/2023 (Originally 09/05/2022)   Cervical Cancer Screening (HPV/Pap Cotest)  04/20/2024 (Originally 02/28/2007)   INFLUENZA VACCINE  08/05/2023   HPV VACCINES  Aged Out   Meningococcal B Vaccine  Aged Out    Discussed health benefits of physical activity, and encouraged her to engage in regular exercise appropriate for her age and condition.  Encounter for screening for cardiovascular disorders -     Lipid panel -     CMP14+EGFR -     CBC with Differential/Platelet  Screening mammogram for breast cancer -     3D Screening Mammogram, Left and Right; Future  Screen for colon cancer -     Ambulatory referral to Gastroenterology  Screening for lung cancer  Encounter for screening for HIV -     HIV Antibody (routine testing w rflx)  Need for hepatitis C screening test -     Hepatitis C antibody  Vitamin D deficiency -     VITAMIN D 25 Hydroxy (Vit-D Deficiency, Fractures)  IFG (impaired fasting glucose) -     Hemoglobin A1c  TSH (thyroid-stimulating hormone deficiency) -     TSH + free T4  Difficulty sleeping Assessment & Plan: Trial on Trazodone 50 mg at bedtime PRN  Explained to go to bed at the same time each night and get up at the same time each morning, including on  the weekends. Make sure your bedroom is quiet, dark, relaxing, and at a comfortable temperature. Remove electronic devices, such as TVs, computers, and smart phones, from the bedroom.   Orders: -  traZODone HCl; Take 1 tablet (50 mg total) by mouth at bedtime as needed.  Dispense: 30 tablet; Refill: 3  Hard of hearing -     Ambulatory referral to Audiology  Encounter for routine adult physical exam with abnormal findings Assessment & Plan: A comprehensive physical examination was completed, and necessary labs were ordered. Screening and health maintenance recommendations have been updated. The patient received counseling on exercise and nutrition. BMI was assessed and discussed Advise for heart health, focus on: Eat more fruits and vegetables: Aim for a variety of colors. Choose whole grains: Brown rice, oats, and whole-wheat bread. Limit unhealthy fats: Avoid trans fats; use olive or avocado oil instead. Include lean proteins: Opt for fish, chicken, beans, and legumes. Reduce sodium: Limit processed foods and add less salt. Stay hydrated: Drink plenty of water. Exercise regularly: Aim for at least 30 minutes of moderate exercise, like walking or cycling, 5 days a week.       Return in about 8 weeks (around 06/16/2023), or if symptoms worsen or fail to improve, for Pap smear, Front Office: Please schedule Mammogram, Back pain.     Avelino Lek Amber Bail, FNP

## 2023-04-21 NOTE — Progress Notes (Deleted)
   New Patient Office Visit   Subjective   Patient ID: Alison Kennedy, female    DOB: 02/28/1965  Age: 58 y.o. MRN: [id number]  CC:  Chief Complaint  Patient presents with   Establish Care    HPI Alison Kennedy 58 year old female, presents to establish care. She  has a past medical history of Alcohol abuse, Depression, and Heart murmur.  HPI    Outpatient Encounter Medications as of 04/21/2023  Medication Sig   albuterol (VENTOLIN HFA) 108 (90 Base) MCG/ACT inhaler Inhale 2 puffs into the lungs every 6 (six) hours as needed for wheezing or shortness of breath.   Calcium-Magnesium-Zinc 333-133-5 MG TABS Take by mouth.   fluticasone-salmeterol (ADVAIR) 250-50 MCG/ACT AEPB Inhale 1 puff into the lungs every 12 (twelve) hours.   furosemide (LASIX) 20 MG tablet Take 1 tablet (20 mg total) by mouth daily.   Multiple Vitamin (MULTI-VITAMIN) tablet Take 1 tablet by mouth daily.   potassium chloride (MICRO-K) 10 MEQ CR capsule Take 10 mEq by mouth 2 (two) times daily. (Patient not taking: Reported on 04/21/2023)   valsartan (DIOVAN) 40 MG tablet Take 1 tablet (40 mg total) by mouth daily.   No facility-administered encounter medications on file as of 04/21/2023.    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  No follow-ups on file.   Avelino Lek Amber Bail, FNP

## 2023-04-22 ENCOUNTER — Other Ambulatory Visit: Payer: Self-pay | Admitting: Family Medicine

## 2023-04-22 LAB — CMP14+EGFR
ALT: 34 IU/L — ABNORMAL HIGH (ref 0–32)
AST: 24 IU/L (ref 0–40)
Albumin: 4.7 g/dL (ref 3.8–4.9)
Alkaline Phosphatase: 123 IU/L — ABNORMAL HIGH (ref 44–121)
BUN/Creatinine Ratio: 22 (ref 9–23)
BUN: 17 mg/dL (ref 6–24)
Bilirubin Total: 0.4 mg/dL (ref 0.0–1.2)
CO2: 24 mmol/L (ref 20–29)
Calcium: 9.7 mg/dL (ref 8.7–10.2)
Chloride: 98 mmol/L (ref 96–106)
Creatinine, Ser: 0.76 mg/dL (ref 0.57–1.00)
Globulin, Total: 2.2 g/dL (ref 1.5–4.5)
Glucose: 83 mg/dL (ref 70–99)
Potassium: 4.7 mmol/L (ref 3.5–5.2)
Sodium: 139 mmol/L (ref 134–144)
Total Protein: 6.9 g/dL (ref 6.0–8.5)
eGFR: 90 mL/min/{1.73_m2} (ref >59)

## 2023-04-22 LAB — CBC WITH DIFFERENTIAL/PLATELET
Basophils Absolute: 0 10*3/uL (ref 0.0–0.2)
Basos: 0 %
EOS (ABSOLUTE): 0.1 10*3/uL (ref 0.0–0.4)
Eos: 1 %
Hematocrit: 42.4 % (ref 34.0–46.6)
Hemoglobin: 14.4 g/dL (ref 11.1–15.9)
Immature Grans (Abs): 0.1 10*3/uL (ref 0.0–0.1)
Immature Granulocytes: 1 %
Lymphocytes Absolute: 3.4 10*3/uL — ABNORMAL HIGH (ref 0.7–3.1)
Lymphs: 40 %
MCH: 32.7 pg (ref 26.6–33.0)
MCHC: 34 g/dL (ref 31.5–35.7)
MCV: 96 fL (ref 79–97)
Monocytes Absolute: 0.5 10*3/uL (ref 0.1–0.9)
Monocytes: 5 %
Neutrophils Absolute: 4.6 10*3/uL (ref 1.4–7.0)
Neutrophils: 53 %
Platelets: 217 10*3/uL (ref 150–450)
RBC: 4.41 x10E6/uL (ref 3.77–5.28)
RDW: 11.6 % — ABNORMAL LOW (ref 11.7–15.4)
WBC: 8.5 10*3/uL (ref 3.4–10.8)

## 2023-04-22 LAB — VITAMIN D 25 HYDROXY (VIT D DEFICIENCY, FRACTURES): Vit D, 25-Hydroxy: 15.2 ng/mL — ABNORMAL LOW (ref 30.0–100.0)

## 2023-04-22 LAB — LIPID PANEL
Chol/HDL Ratio: 4 ratio (ref 0.0–4.4)
Cholesterol, Total: 234 mg/dL — ABNORMAL HIGH (ref 100–199)
HDL: 58 mg/dL (ref >39)
LDL Chol Calc (NIH): 157 mg/dL — ABNORMAL HIGH (ref 0–99)
Triglycerides: 110 mg/dL (ref 0–149)
VLDL Cholesterol Cal: 19 mg/dL (ref 5–40)

## 2023-04-22 LAB — HIV ANTIBODY (ROUTINE TESTING W REFLEX): HIV Screen 4th Generation wRfx: NONREACTIVE

## 2023-04-22 LAB — HEMOGLOBIN A1C
Est. average glucose Bld gHb Est-mCnc: 105 mg/dL
Hgb A1c MFr Bld: 5.3 % (ref 4.8–5.6)

## 2023-04-22 LAB — TSH+FREE T4
Free T4: 1.21 ng/dL (ref 0.82–1.77)
TSH: 1.82 u[IU]/mL (ref 0.450–4.500)

## 2023-04-22 LAB — HEPATITIS C ANTIBODY: Hep C Virus Ab: NONREACTIVE

## 2023-04-22 MED ORDER — ROSUVASTATIN CALCIUM 20 MG PO TABS: 20.0000 mg | ORAL_TABLET | Freq: Every day | ORAL | 3 refills | Status: AC

## 2023-04-22 NOTE — Progress Notes (Signed)
 Please inform patient,   Cholesterol levels elevated, start lifestyle modifications and follow diet low in saturated fat. Plan: Crestor  20 mg once daily- medication sent to pharmacy   Diet to Lower Cholesterol Eat More: Oats, beans, and lentils: High in soluble fiber. Fatty fish: Salmon, tuna (rich in omega-3s). Nuts and seeds: Almonds, walnuts, flaxseeds. Fruits and vegetables: Apples, berries, leafy greens. Healthy fats: Olive oil, avocado. Limit: Saturated fats: Butter, cream, fatty meats. Trans fats: Fried foods, processed snacks. Sugar and refined carbs: Sweets, white bread. Focus on whole foods, healthy fats, and fiber to improve heart health! Maintain an exercise routine 3 to 5 days a week for a minimum total of 150 minutes.   Vitamin D  levels low, I advise to taking  over the counter supplements of vitamin D  1000 IU/day to prevent low vitamin D  levels.  Consume Vitamin D -Rich Foods: Fatty Fish: Salmon, mackerel, tuna, and sardines. Egg Yolks: A good source if eaten whole. Fortified Foods: Milk, orange juice, cereals, and plant-based milks (like almond or soy milk). Mushrooms: Especially those exposed to sunlight or UV light. Cod Liver Oil: A concentrated source of vitamin D . Including these foods in your diet can help boost vitamin D  levels   Symptoms of Low Vitamin D :  Fatigue and low energy. Bone pain and muscle weakness. Increased risk of fractures or weak bones. Hair loss or thinning.     Kidney and thyroid labs normal.

## 2023-05-05 ENCOUNTER — Other Ambulatory Visit (HOSPITAL_COMMUNITY)
Admission: RE | Admit: 2023-05-05 | Discharge: 2023-05-05 | Disposition: A | Discharge status: Home / Self Care | Source: Ambulatory Visit | Attending: Physician Assistant | Admitting: Physician Assistant

## 2023-05-05 ENCOUNTER — Encounter: Payer: Self-pay | Admitting: Physician Assistant

## 2023-05-05 ENCOUNTER — Ambulatory Visit: Attending: Student | Admitting: Physician Assistant

## 2023-05-05 VITALS — BP 130/62 | HR 81 | Ht 60.0 in | Wt 136.8 lb

## 2023-05-05 DIAGNOSIS — Z720 Tobacco use: Secondary | ICD-10-CM

## 2023-05-05 DIAGNOSIS — R0789 Other chest pain: Secondary | ICD-10-CM

## 2023-05-05 DIAGNOSIS — I2609 Other pulmonary embolism with acute cor pulmonale: Secondary | ICD-10-CM

## 2023-05-05 DIAGNOSIS — I493 Ventricular premature depolarization: Secondary | ICD-10-CM | POA: Diagnosis not present

## 2023-05-05 DIAGNOSIS — E785 Hyperlipidemia, unspecified: Secondary | ICD-10-CM

## 2023-05-05 DIAGNOSIS — I5022 Chronic systolic (congestive) heart failure: Secondary | ICD-10-CM

## 2023-05-05 LAB — CBC
HCT: 43.4 % (ref 36.0–46.0)
Hemoglobin: 14.5 g/dL (ref 12.0–15.0)
MCH: 32.4 pg (ref 26.0–34.0)
MCHC: 33.4 g/dL (ref 30.0–36.0)
MCV: 97.1 fL (ref 80.0–100.0)
Platelets: 195 10*3/uL (ref 150–400)
RBC: 4.47 MIL/uL (ref 3.87–5.11)
RDW: 11.9 % (ref 11.5–15.5)
WBC: 9 10*3/uL (ref 4.0–10.5)
nRBC: 0 % (ref 0.0–0.2)

## 2023-05-05 LAB — BASIC METABOLIC PANEL WITH GFR
Anion gap: 11 (ref 5–15)
BUN: 14 mg/dL (ref 6–20)
CO2: 23 mmol/L (ref 22–32)
Calcium: 9.5 mg/dL (ref 8.9–10.3)
Chloride: 97 mmol/L — ABNORMAL LOW (ref 98–111)
Creatinine, Ser: 0.77 mg/dL (ref 0.44–1.00)
GFR, Estimated: >60 mL/min (ref >60)
Glucose, Bld: 92 mg/dL (ref 70–99)
Potassium: 3.9 mmol/L (ref 3.5–5.1)
Sodium: 131 mmol/L — ABNORMAL LOW (ref 135–145)

## 2023-05-05 MED ORDER — BISOPROLOL FUMARATE 5 MG PO TABS: 2.5000 mg | ORAL_TABLET | Freq: Every day | ORAL | 2 refills | Status: DC

## 2023-05-05 MED ORDER — APIXABAN 5 MG PO TABS: 5.0000 mg | ORAL_TABLET | Freq: Two times a day (BID) | ORAL | 1 refills | Status: DC

## 2023-05-05 NOTE — Progress Notes (Signed)
 Cardiology Office Note:  .   Date:  05/05/2023  ID:  Alison Kennedy, DOB 09/17/1965, MRN [id number] PCP: Alison Comment, FNP  [facility] Providers Cardiologist:  Alison Pointer, MD {  History of Present Illness: .   Alison Kennedy is a 58 y.o. 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   After ER Visit in 11/2022 for DOE, bilateral LE edema, and elevated BNP 4500, she was diagnosed with CHF. No echo was completed. She established care with cardiology on 04/06/2023 with Alison Kennedy. At that time, she noted improvement in symptoms, however noted fluctuating SOB. Continued on Valsartan  40 mg  and Lasix  20 mg daily.  Follow up ECHO showed LVEF 40-45% with global hypokinesis. Per Alison Kennedy, she has imaging evidence of chronic nonocclusive PE in 12/2022 at [facility]. She is not on systemic AC. Recommended starting AC and VQ scan prior to next appointment. Also recommended setting up L/R [facility] and optimizing GDMT at next appointment.   During interview, patient reported ongoing intermittent left side chest wall burning pain, 3/10, that has been present since 11/2022, not associated with exertion or position changes. Noted SOB only present with extreme exertion. Denied any edema, orthopnea, PND, palpitations, dizziness, syncope, or signs of bleeding. She able to complete house work and do her own grocery shopping without any concerns. Admits to tobacco use, 1 ppd. Denies any drugs, ETOH.   Studies Reviewed: Aaron Aas    ECHO IMPRESSIONS 04/2023  1. Left ventricular ejection fraction, by estimation, is 40 to 45%. Left  ventricular ejection fraction by 3D volume is 48 %. The left ventricle has  mildly decreased function. The left ventricle demonstrates global  hypokinesis. Left ventricular diastolic   parameters are consistent with Grade I diastolic dysfunction (impaired  relaxation).   2. Right ventricular systolic  function is normal. The right ventricular  size is normal. Tricuspid regurgitation signal is inadequate for assessing  PA pressure.   3. The mitral valve is normal in structure. No evidence of mitral valve  regurgitation. No evidence of mitral stenosis.   4. The aortic valve is tricuspid. Aortic valve regurgitation is not  visualized. No aortic stenosis is present.   5. The inferior vena cava is normal in size with greater than 50%  respiratory variability, suggesting right atrial pressure of 3 mmHg.   Comparison(s): No prior Echocardiogram.   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  CONSTELLATION OF FINDINGS SUSPICIOUS FOR CONGESTIVE HEART FAILURE. SMALL VOLUME OF ASCITES IN THE UPPER ABDOMEN.       Physical Exam:   VS:  BP 130/62 (BP Location: Left Arm, Cuff Size: Normal)   Pulse 81   Ht 5' (1.524 m)   Wt 136 lb 12.8 oz (62.1 kg)   SpO2 94%   BMI 26.72 kg/m    Wt Readings from Last 3 Encounters:  05/05/23 136 lb 12.8 oz (62.1 kg)  04/21/23 136 lb 0.6 oz (61.7 kg)  04/06/23 137 lb (62.1 kg)    GEN: Well nourished, well developed in no acute distress NECK: No JVD; No carotid bruits CARDIAC: irregular early beats, no murmurs, rubs, gallops RESPIRATORY:  Clear to auscultation without rales, wheezing or rhonchi  ABDOMEN: Soft, non-tender, non-distended EXTREMITIES:  No edema; No deformity   ASSESSMENT AND PLAN: .    HFmrEF  04/2023 ECHO: EF 40-45%,  mildly decreased LV function, global hypokinesis, G1DD No previous ECHO for comparison. Per Alison Kennedy, ordered R/L cath. Scheduled 05/12/2023 with Alison Kennedy at 10:30 am. Discussed no lasix  morning of cath.  Continue on Valsartan  40 mg, Lasix  20 mg daily Ordered Bisoprolol  2.5 mg daily with hx of COPD.  Discussed SGLT2i. No hx of UTIs. Will start at follow up apt after cath.   PE  Per Alison Kennedy, can start Eliquis  5 mg BID and Order VQ  scan to be completed ASAP.  Discussed to stop Eliquis  2 days prior to Cath. Last dose should be 5/5 evening dose. Can resume Eliquis  after cath.  IF VQ scan negative, can d/c Eliquis .  No tachycardia, tachypnea or hypoxic on exam.  PVC She denied any palpations. However, exam revealed irregular early beats.  EKG showed NSR, HR 78, with frequent PVC.  BMP Labs for cath pending. Based on results, can consider K supplements.  Started bisoprolol  as above.  Can consider cardiac monitor in future for PVC burden.  Atypical CP  Reported ongoing intermittent left side chest wall burning pain, 3/10, that has been present since 11/2022, not associated with exertion or position changes.  No ischemic changes noted on EKG in the interim. PVC noted today as above.  Less concern for ischemia with presentation, no EKG changes, however, L/R cath pending as above.   HLD  04/2023: LDL 157. Started on Crestor  20 mg  Continue Crestor  20 mg. Will plan to recheck FLP and LFT in 07/2023  Tobacco use  1 PPD since 58 y/o.  Cessation encouraged but not interested at this time. Tried to patches and gums previously but unsuccessful.     Informed Consent   Shared Decision Making/Informed Consent The risks [stroke (1 in 1000), death (1 in 1000), kidney failure [usually temporary] (1 in 500), bleeding (1 in 200), allergic reaction [possibly serious] (1 in 200)], benefits (diagnostic support and management of coronary artery disease) and alternatives of a cardiac catheterization were discussed in detail with Alison Kennedy and she is willing to proceed.  Dispo: L/R HC with Alison Kennedy 05/12/2023 at 10:30 am. Ordered V/Q Scan. Ordered Bisoprolol  2.5 mg every day and Eliquis  5 mg BID. Hold Eliquis  2 day before cath. No Lasix  on the day of cath.   Signed, Metta Actis, PA-C

## 2023-05-05 NOTE — Patient Instructions (Signed)
 Medication Instructions:  Your physician recommends that you continue on your current medications as directed. Please refer to the Current Medication list given to you today.  Start Bisoprolol  2.5 mg Daily  Start Eliquis  5 mg Two Times Daily   *If you need a refill on your cardiac medications before your next appointment, please call your pharmacy*  Lab Work: Your physician recommends that you return for lab work today. BMET, CBC  If you have labs (blood work) drawn today and your tests are completely normal, you will receive your results only by: MyChart Message (if you have MyChart) OR A paper copy in the mail If you have any lab test that is abnormal or we need to change your treatment, we will call you to review the results.  Testing/Procedures: Your physician has requested that you have a cardiac catheterization. Cardiac catheterization is used to diagnose and/or treat various heart conditions. Doctors may recommend this procedure for a number of different reasons. The most common reason is to evaluate chest pain. Chest pain can be a symptom of coronary artery disease (CAD), and cardiac catheterization can show whether plaque is narrowing or blocking your heart's arteries. This procedure is also used to evaluate the valves, as well as measure the blood flow and oxygen levels in different parts of your heart. For further information please visit [email or web address] Please follow instruction sheet, as given.   Follow-Up: At [facility], you and your health needs are our priority.  As part of our continuing mission to provide you with exceptional heart care, our providers are all part of one team.  This team includes your primary Cardiologist (physician) and Advanced Practice Providers or APPs (Physician Assistants and Nurse Practitioners) who all work together to provide you with the care you need, when you need it.  Your next appointment:   4 -6 week(s)  Provider:   Woodfin Hays, PA-C or Alison Benes, PA-C     We recommend signing up for the patient portal called "MyChart".  Sign up information is provided on this After Visit Summary.  MyChart is used to connect with patients for Virtual Visits (Telemedicine).  Patients are able to view lab/test results, encounter notes, upcoming appointments, etc.  Non-urgent messages can be sent to your provider as well.   To learn more about what you can do with MyChart, go to [email or web address].   Other Instructions Thank you for choosing [facility]!   [facility] A DEPT OF [facility] [address] [place] [address] Dept: [phone] Loc: [phone]  Alison Kennedy  05/05/2023  You are scheduled for a Cardiac Catheterization on Thursday, May 8 with Dr.  Berry Kennedy  .  1. Please arrive at the [facility] (Main Entrance A) at [facility]: [address] [place], [place] [address] at 8:30 AM (This time is 2 hour(s) before your procedure to ensure your preparation).   Free valet parking service is available. You will check in at ADMITTING. The support person will be asked to wait in the waiting room.  It is OK to have someone drop you off and come back when you are ready to be discharged.    Special note: Every effort is made to have your procedure done on time. Please understand that emergencies sometimes delay scheduled procedures.  2. Diet: Do not eat solid foods after midnight.  The patient may have clear liquids until 5am upon the  day of the procedure.  3. Labs: You will need to have blood drawn on Thursday, May 1 at [facility] [address], [employer]  Open: 7am - 6pm, Sat 8am - 12 noon   Phone: [phone]. You do not need to be fasting.  4. Medication instructions in preparation for your procedure:   Contrast Allergy: No    Current Outpatient Medications (Cardiovascular):    furosemide  (LASIX ) 20 MG  tablet, Take 1 tablet (20 mg total) by mouth daily.   rosuvastatin  (CRESTOR ) 20 MG tablet, Take 1 tablet (20 mg total) by mouth daily.   valsartan  (DIOVAN ) 40 MG tablet, Take 1 tablet (40 mg total) by mouth daily.  Current Outpatient Medications (Respiratory):    albuterol  (VENTOLIN  HFA) 108 (90 Base) MCG/ACT inhaler, Inhale 2 puffs into the lungs every 6 (six) hours as needed for wheezing or shortness of breath.   fluticasone -salmeterol (ADVAIR) 250-50 MCG/ACT AEPB, Inhale 1 puff into the lungs every 12 (twelve) hours.    Current Outpatient Medications (Other):    Calcium -Magnesium -Zinc 333-133-5 MG TABS, Take by mouth.   Multiple Vitamin (MULTI-VITAMIN) tablet, Take 1 tablet by mouth daily.   traZODone  (DESYREL ) 50 MG tablet, Take 1 tablet (50 mg total) by mouth at bedtime as needed.   potassium chloride (MICRO-K) 10 MEQ CR capsule, Take 10 mEq by mouth 2 (two) times daily. (Patient not taking: Reported on 05/05/2023) *For reference purposes while preparing patient instructions.   Delete this med list prior to printing instructions for patient.*  Stop taking Eliquis  (Apixiban) on Tuesday, May 6.  Stop taking, Lasix  (Furosemide )  Thursday, May 8,  On the morning of your procedure, take your Aspirin 81 mg and any morning medicines NOT listed above.  You may use sips of water.  5. Plan to go home the same day, you will only stay overnight if medically necessary. 6. Bring a current list of your medications and current insurance cards. 7. You MUST have a responsible person to drive you home. 8. Someone MUST be with you the first 24 hours after you arrive home or your discharge will be delayed. 9. Please wear clothes that are easy to get on and off and wear slip-on shoes.  Thank you for allowing us  to care for you!   -- [facility]

## 2023-05-09 ENCOUNTER — Encounter (HOSPITAL_COMMUNITY)
Admission: RE | Admit: 2023-05-09 | Discharge: 2023-05-09 | Disposition: A | Discharge status: Home / Self Care | Source: Ambulatory Visit | Attending: Physician Assistant | Admitting: Physician Assistant

## 2023-05-09 ENCOUNTER — Ambulatory Visit (HOSPITAL_COMMUNITY)
Admission: RE | Admit: 2023-05-09 | Discharge: 2023-05-09 | Disposition: A | Discharge status: Home / Self Care | Source: Ambulatory Visit | Attending: Family Medicine | Admitting: Family Medicine

## 2023-05-09 ENCOUNTER — Encounter (HOSPITAL_COMMUNITY): Payer: Self-pay

## 2023-05-09 ENCOUNTER — Ambulatory Visit (HOSPITAL_COMMUNITY)
Admission: RE | Admit: 2023-05-09 | Discharge: 2023-05-09 | Disposition: A | Discharge status: Home / Self Care | Source: Ambulatory Visit | Attending: Physician Assistant | Admitting: Physician Assistant

## 2023-05-09 DIAGNOSIS — Z1231 Encounter for screening mammogram for malignant neoplasm of breast: Secondary | ICD-10-CM | POA: Diagnosis not present

## 2023-05-09 DIAGNOSIS — I2609 Other pulmonary embolism with acute cor pulmonale: Secondary | ICD-10-CM | POA: Diagnosis present

## 2023-05-09 DIAGNOSIS — Z86711 Personal history of pulmonary embolism: Secondary | ICD-10-CM | POA: Diagnosis not present

## 2023-05-09 DIAGNOSIS — I509 Heart failure, unspecified: Secondary | ICD-10-CM | POA: Diagnosis not present

## 2023-05-09 DIAGNOSIS — J449 Chronic obstructive pulmonary disease, unspecified: Secondary | ICD-10-CM | POA: Diagnosis not present

## 2023-05-09 DIAGNOSIS — I2699 Other pulmonary embolism without acute cor pulmonale: Secondary | ICD-10-CM | POA: Diagnosis not present

## 2023-05-09 MED ORDER — TECHNETIUM TO 99M ALBUMIN AGGREGATED
4.0000 | Freq: Once | INTRAVENOUS | Status: AC | PRN
Start: 2023-05-09 — End: 2023-05-09
  Administered 2023-05-09: 4 via INTRAVENOUS

## 2023-05-10 ENCOUNTER — Telehealth: Payer: Self-pay | Admitting: *Deleted

## 2023-05-10 NOTE — Telephone Encounter (Signed)
 Cardiac Catheterization scheduled at [facility] for: Thursday May 12, 2023 10:30 AM Arrival time [facility] Main Entrance A at: 8:30 AM  Nothing to eat after midnight prior to procedure, clear liquids until 5 AM day of procedure.  Medication instructions: -Hold:  Eliquis -none 05/10/23 until post procedure  Lasix /KCl-AM of procedure -Other usual morning medications can be taken with sips of water including aspirin 81 mg.  Plan to go home the same day, you will only stay overnight if medically necessary.  You must have responsible adult to drive you home.  Someone must be with you the first 24 hours after you arrive home.  Reviewed procedure instructions with patient.

## 2023-05-11 ENCOUNTER — Telehealth: Payer: Self-pay | Admitting: Internal Medicine

## 2023-05-11 ENCOUNTER — Encounter: Payer: Self-pay | Admitting: *Deleted

## 2023-05-11 NOTE — Telephone Encounter (Signed)
 This is a known PE, B.Strader,PA-C is in discussion with Dr.Mallipeddi.

## 2023-05-11 NOTE — Telephone Encounter (Signed)
 Reviewed procedure instructions with patient.

## 2023-05-11 NOTE — Telephone Encounter (Signed)
 Sherrlyn Dolores with [facility] is calling to report abnormal nuclear med results.

## 2023-05-11 NOTE — Telephone Encounter (Signed)
Pt returning nurses call regarding procedure instructions. Please advise

## 2023-05-12 ENCOUNTER — Telehealth: Payer: Self-pay | Admitting: Physician Assistant

## 2023-05-12 ENCOUNTER — Other Ambulatory Visit: Payer: Self-pay | Admitting: Physician Assistant

## 2023-05-12 ENCOUNTER — Telehealth: Payer: Self-pay | Admitting: Internal Medicine

## 2023-05-12 ENCOUNTER — Encounter (HOSPITAL_COMMUNITY): Admission: RE | Payer: Self-pay | Source: Home / Self Care

## 2023-05-12 ENCOUNTER — Ambulatory Visit (HOSPITAL_COMMUNITY): Admission: RE | Admit: 2023-05-12 | Source: Home / Self Care | Admitting: Cardiology

## 2023-05-12 ENCOUNTER — Ambulatory Visit (HOSPITAL_COMMUNITY)

## 2023-05-12 DIAGNOSIS — I2699 Other pulmonary embolism without acute cor pulmonale: Secondary | ICD-10-CM

## 2023-05-12 DIAGNOSIS — R0609 Other forms of dyspnea: Secondary | ICD-10-CM

## 2023-05-12 SURGERY — RIGHT/LEFT HEART CATH AND CORONARY ANGIOGRAPHY
Anesthesia: LOCAL

## 2023-05-12 MED ORDER — ELIQUIS DVT/PE STARTER PACK 5 MG PO TBPK: ORAL_TABLET | ORAL | 0 refills | Status: DC

## 2023-05-12 NOTE — Telephone Encounter (Signed)
 Patient is requesting to speak with RN Chryl Crank. Please advise.

## 2023-05-12 NOTE — Telephone Encounter (Signed)
 Discussed with patient that Pulmonary Perfusion test showed PE (Clot in Lung vessels). Patient will need to start new medication dose of Eliquis : Start Eliquis  10 mg twice a day for 7 day then return to current dose 5 mg twice a day. Per Dr. Mallipeddi, schedule patient for CTA as soon as possible. Cardiac Cath was canceled today and will reassess the need for Cath once PE resolved and symptoms reassessed. Denies any worsening SOB. Continue to follow up with cardiology on 06/09/2023 and Pulmonary 06/20/2023.  She understands the plan and agrees with treatment.   Signed,  Metta Actis, PA-C 05/12/2023, 11:49 AM

## 2023-05-12 NOTE — Telephone Encounter (Signed)
 Patient was calling our PA, [place] back,not Kisha.  Patient will need chest CTA

## 2023-05-12 NOTE — Progress Notes (Signed)
 Updated CTA PE to STAT as requested.

## 2023-05-12 NOTE — Pre-Procedure Instructions (Signed)
 Notified this am by Dr Raynaldo Call that she would like us  cancel the heart cath scheduled for today..  Notified the patient that cath was canceled today and that the office will contact her about what to do next.

## 2023-05-13 ENCOUNTER — Ambulatory Visit (HOSPITAL_COMMUNITY)
Admission: RE | Admit: 2023-05-13 | Discharge: 2023-05-13 | Disposition: A | Discharge status: Home / Self Care | Source: Ambulatory Visit | Attending: Physician Assistant

## 2023-05-13 DIAGNOSIS — R0609 Other forms of dyspnea: Secondary | ICD-10-CM | POA: Insufficient documentation

## 2023-05-13 DIAGNOSIS — I2699 Other pulmonary embolism without acute cor pulmonale: Secondary | ICD-10-CM | POA: Diagnosis present

## 2023-05-13 DIAGNOSIS — I517 Cardiomegaly: Secondary | ICD-10-CM | POA: Diagnosis not present

## 2023-05-13 MED ORDER — IOHEXOL 350 MG/ML SOLN
100.0000 mL | Freq: Once | INTRAVENOUS | Status: AC | PRN
  Administered 2023-05-13: 75 mL via INTRAVENOUS

## 2023-05-16 ENCOUNTER — Telehealth: Payer: Self-pay | Admitting: Internal Medicine

## 2023-05-16 NOTE — Telephone Encounter (Signed)
 Notify patient: CTA was reviewed with Dr. Mallipeddi and suggested chronic PE given V/Q scan findings. Continue with Eliquis  as previously discussed. Still plan to follow up with cardiology on 06/09/2023.   The patient has been notified of the result and verbalized understanding.  All questions (if any) were answered. Carole Churches, LPN 1/47/8295 6:21 AM

## 2023-05-16 NOTE — Telephone Encounter (Signed)
 Patient is returning phone call in regard to CTA results. Please advise.

## 2023-05-17 ENCOUNTER — Ambulatory Visit: Payer: Self-pay | Admitting: Physician Assistant

## 2023-05-17 ENCOUNTER — Ambulatory Visit: Admitting: Gastroenterology

## 2023-05-19 ENCOUNTER — Telehealth: Payer: Self-pay

## 2023-05-19 ENCOUNTER — Other Ambulatory Visit (HOSPITAL_COMMUNITY): Payer: Self-pay

## 2023-05-19 NOTE — Telephone Encounter (Signed)
 Pharmacy Patient Advocate Encounter   Received notification from CoverMyMeds that prior authorization for Fluticasone -Salmeterol 250-50MCG/ACT aerosol powder  is required/requested.   Insurance verification completed.   The patient is insured through [facility] .   Per test claim:  Brand Advair Diksus is preferred by the insurance.  If suggested medication is appropriate, Please send in a new RX and discontinue this one. If not, please advise as to why it's not appropriate so that we may request a Prior Authorization. Please note, some preferred medications may still require a PA.  If the suggested medications have not been trialed and there are no contraindications to their use, the PA will not be submitted, as it will not be approved.

## 2023-05-25 ENCOUNTER — Other Ambulatory Visit (HOSPITAL_COMMUNITY)
Admission: RE | Admit: 2023-05-25 | Discharge: 2023-05-25 | Disposition: A | Discharge status: Home / Self Care | Source: Ambulatory Visit | Attending: Physician Assistant | Admitting: Physician Assistant

## 2023-05-25 ENCOUNTER — Ambulatory Visit: Payer: Self-pay | Admitting: Physician Assistant

## 2023-05-25 ENCOUNTER — Telehealth: Payer: Self-pay | Admitting: Internal Medicine

## 2023-05-25 DIAGNOSIS — K921 Melena: Secondary | ICD-10-CM

## 2023-05-25 LAB — CBC
HCT: 39.4 % (ref 36.0–46.0)
Hemoglobin: 13.4 g/dL (ref 12.0–15.0)
MCH: 33.1 pg (ref 26.0–34.0)
MCHC: 34 g/dL (ref 30.0–36.0)
MCV: 97.3 fL (ref 80.0–100.0)
Platelets: 171 10*3/uL (ref 150–400)
RBC: 4.05 MIL/uL (ref 3.87–5.11)
RDW: 12 % (ref 11.5–15.5)
WBC: 7.3 10*3/uL (ref 4.0–10.5)
nRBC: 0 % (ref 0.0–0.2)

## 2023-05-25 NOTE — Telephone Encounter (Signed)
 Pt stated she was advised by Odetta Benes, PA-C to inform office if she has blood in her stool and she has so she'd like a callback. Please advise.

## 2023-05-25 NOTE — Progress Notes (Signed)
 GI Office Note    Referring Provider: Del Orbe Polanco, Ilian* Primary Care Physician:  Rosanna Comment, FNP  Primary Gastroenterologist: Rolando Cliche. Mordechai Murielle, DO   Chief Complaint   Chief Complaint  Patient presents with   New Patient (Initial Visit)    Pt here for visit before colonoscopy   History of Present Illness   Alison Kennedy is a 58 y.o. female presenting today at the request of Del Orbe Polanco, Ilian* for colonoscopy given reports of rectal bleeding with bowel movement.  Per review of cardiology notes patient also called in reporting she was having difficulty with bowel movement and then saw streaks of blood.  History of heart murmur, depression, and alcohol abuse.  Patient ED visit in November 2024 for dyspnea and bilateral lower extremity edema.  She was diagnosed with CHF and then has had follow-up outpatient with cardiology, Dr. Alvera Austria.  Last echo with EF 40-45% with global hypokinesis.  Per report from Dr. Shaune Delaine in the past she has had imaging evidence of chronic nonocclusive PE in December 2024 at [facility] and was not on anticoagulation.  She was recommended to start on Eliquis  and obtain a VQ scan.  She also recommended setting up a right left heart cath.  She also admitted to tobacco use, 1 pack/day.  Denied drugs or EtOH.  Reported that a VQ scan was negative that she could DC Eliquis .  Would be stopped briefly prior to heart cath.  For heart failure she is maintained on valsartan , Lasix , and bisoprolol .  EKG showed some PVCs.  On Crestor  for HLD.  Today: Diagnosed with heart failure in [place]. She was going through a lot at that time. No prior colonoscopy. Colon cancer history in her father and maternal aunt.   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   Uses 2 inhalers for her COPD - diagnosed long time Still smokes - about 1 ppd. No oxygen use. DOE usually.   This past Monday she reported seeing some blood in her stool and  gave her a stool card to do at home - has not been abl to do that just yet.   Denies straining with BM, sometimes stools are hard. Feels like she empties well. Usually eats at last once a day. No diarrhea. Appetite is normal for her, at least 1-2 meals per day. States in [place] when she was going through evaluation she was 160 lbs and after diuretics she got back down to 130-140s. No current edema like it was previously. Denies history of hemorrhoids.   Denies reflux issues, dysphagia.   Currently living with her mother.   Wt Readings from Last 3 Encounters:  05/26/23 141 lb 9.6 oz (64.2 kg)  05/05/23 136 lb 12.8 oz (62.1 kg)  04/21/23 136 lb 0.6 oz (61.7 kg)    Current Outpatient Medications  Medication Sig Dispense Refill   albuterol  (VENTOLIN  HFA) 108 (90 Base) MCG/ACT inhaler Inhale 2 puffs into the lungs every 6 (six) hours as needed for wheezing or shortness of breath. 18 g 1   apixaban  (ELIQUIS ) 5 MG TABS tablet Take 1 tablet (5 mg total) by mouth 2 (two) times daily. 180 tablet 1   bisoprolol  (ZEBETA ) 5 MG tablet Take 0.5 tablets (2.5 mg total) by mouth daily. 45 tablet 2   Cholecalciferol (VITAMIN D -3) 125 MCG (5000 UT) TABS Take 5,000 Units by mouth daily.     fluticasone -salmeterol (ADVAIR) 250-50 MCG/ACT AEPB Inhale 1  puff into the lungs every 12 (twelve) hours. 60 each 11   furosemide  (LASIX ) 20 MG tablet Take 1 tablet (20 mg total) by mouth daily. 90 tablet 3   rosuvastatin  (CRESTOR ) 20 MG tablet Take 1 tablet (20 mg total) by mouth daily. 90 tablet 3   traZODone  (DESYREL ) 50 MG tablet Take 1 tablet (50 mg total) by mouth at bedtime as needed. 30 tablet 3   valsartan  (DIOVAN ) 40 MG tablet Take 1 tablet (40 mg total) by mouth daily. 30 tablet 11   Apixaban  Starter Pack, 10mg  and 5mg , (ELIQUIS  DVT/PE STARTER PACK) Take as directed on package: start with two-5mg  tablets twice daily for 7 days. On day 8, switch to one-5mg  tablet twice daily. (Patient not taking: Reported on  05/26/2023) 74 each 0   No current facility-administered medications for this visit.    Past Medical History:  Diagnosis Date   Alcohol abuse    COPD (chronic obstructive pulmonary disease) (HCC)    Depression    Heart failure (HCC)    Heart murmur    Hyperlipidemia     Past Surgical History:  Procedure Laterality Date   DILATION AND CURETTAGE OF UTERUS     x2   EXPLORATION POST OPERATIVE OPEN HEART     "hole in heart" repaired    Family History  Problem Relation Age of Onset   COPD Father    Colon cancer Father        unsure age - passed in 32s   Asthma Brother    Hypertension Brother    Hyperlipidemia Brother    Heart disease Maternal Grandfather    COPD Maternal Grandfather    Heart murmur Daughter    Asthma Son    Colon cancer Maternal Aunt        s/p resection    Allergies as of 05/26/2023   (No Known Allergies)    Social History   Socioeconomic History   Marital status: Divorced    Spouse name: Not on file   Number of children: 4   Years of education: Not on file   Highest education level: 12th grade  Occupational History   Not on file  Tobacco Use   Smoking status: Every Day    Current packs/day: 1.00    Average packs/day: 1 pack/day for 30.0 years (30.0 ttl pk-yrs)    Types: Cigarettes   Smokeless tobacco: Never  Vaping Use   Vaping status: Never Used  Substance and Sexual Activity   Alcohol use: Not Currently    Comment: fomer heavy drinker   Drug use: No    Comment: per pt's father   Sexual activity: Not Currently  Other Topics Concern   Not on file  Social History Narrative   Not on file   Social Drivers of Health   Financial Resource Strain: Not on file  Food Insecurity: Not on file  Transportation Needs: Not on file  Physical Activity: Not on file  Stress: Not on file  Social Connections: Not on file  Intimate Partner Violence: Not At Risk (11/14/2022)   Received from [facility]   HITS    Over the last 12 months how  often did your partner physically hurt you?: Never    Over the last 12 months how often did your partner insult you or talk down to you?: Never    Over the last 12 months how often did your partner threaten you with physical harm?: Never    Over the last 12  months how often did your partner scream or curse at you?: Never     Review of Systems   Gen: Denies any fever, chills, fatigue, weight loss, lack of appetite.  CV: Denies chest pain, heart palpitations, peripheral edema, syncope.  Resp: + DOE.  Denies shortness of breath at rest. Denies cough.  GI: see HPI GU : Denies urinary burning, urinary frequency, urinary hesitancy MS: Denies joint pain, muscle weakness, cramps, or limitation of movement.  Derm: Denies rash, itching, dry skin Psych: + depression.  Denies , anxiety, memory loss, and confusion Heme: Denies bruising, bleeding, and enlarged lymph nodes.  Physical Exam   BP 112/74   Pulse (!) 55   Temp 98.4 F (36.9 C)   Ht 5' (1.524 m)   Wt 141 lb 9.6 oz (64.2 kg)   BMI 27.65 kg/m   General:   Alert and oriented. Pleasant and cooperative. Well-nourished and well-developed.  Head:  Normocephalic and atraumatic. Eyes:  Without icterus, sclera clear and conjunctiva pink.  Ears:  Normal auditory acuity. Mouth:  No deformity or lesions, oral mucosa pink.  Lungs: Mild inspiratory wheezing noted bilaterally. Heart:  S1, S2 present without murmurs appreciated.  Abdomen:  +BS, soft, non-tender and non-distended. No HSM noted. No guarding or rebound. No masses appreciated.  Rectal:  offered - patient Deferred  Msk:  Symmetrical without gross deformities. Normal posture. Extremities:  Without edema. Neurologic:  Alert and  oriented x4;  grossly normal neurologically. Skin:  Intact without significant lesions or rashes. Psych:  Alert and cooperative. Normal mood and affect.  Assessment   Alison Kennedy is a 59 y.o. female with a history of nonocclusive subsegmental PE,  HFrEF, PVCs, HLD, tobacco abuse, depression, and alcohol abuse presenting today with reports of recent rectal bleeding and need for colonoscopy.  Rectal bleeding: Reports just the other day ago she had some red blood streaking on her stool.  Denies this happening in the past.  Denies any history of known hemorrhoids.  Denies any constipation or straining, has daily bowel movement that is soft.  Denies incomplete emptying.  Rectal exam offered today however declined given scheduling colonoscopy.  Suspect benign anorectal source given single occurrence on Monday and nothing since.  Unable to rule out other potential causes as of yet such as polyps, AVMs, or malignancy however all of these are less likely given current reported symptoms.  Encouraged her to complete FOBT as ordered by cardiology.  She is currently maintained on Eliquis  due to history of subsegmental PE and awaiting further evaluation with cardiology, has upcoming appointment in a few weeks.  Screening for colon cancer, family history of colon cancer: Reported colon cancer history in her father as well as her maternal aunt, unsure what ages but father was deceased in his 6s but states that this was not from colon cancer.  She denies any significant alarm symptoms at this time other than the rectal bleeding, weight stable.  Denies abdominal pain, melena, lack of appetite, early satiety.  Given her cardiac history we will obtain a clearance prior to scheduling.  PLAN   Proceed with colonoscopy with propofol by Dr. Mordechai Hayley  in near future: the risks, benefits, and alternatives have been discussed with the patient in detail. The patient states understanding and desires to proceed. ASA 3 Cardiac clearance - medical and to hold Eliquis  2 days Follow up stool FOBT Follow up as needed post procedure if ongoing rectal bleeding.    Julian Obey, MSN, FNP-BC, AGACNP-BC [facility]

## 2023-05-25 NOTE — Telephone Encounter (Signed)
 Pt notified of the need of lab work and stool sample. Pt reports that she was set up to see GI by her PCP.

## 2023-05-25 NOTE — Telephone Encounter (Signed)
Returned call to pt. No answer. Left msg to return call.

## 2023-05-25 NOTE — Telephone Encounter (Signed)
 Pt called to report blood in her stool. She states that after having a stool she noticed a streak of blood on the stool. Pt denies difficulty having stool. She does report that this is happening daily since Monday. She reports no blood when she wiped. Please advise.

## 2023-05-26 ENCOUNTER — Telehealth: Payer: Self-pay | Admitting: *Deleted

## 2023-05-26 ENCOUNTER — Encounter: Payer: Self-pay | Admitting: Gastroenterology

## 2023-05-26 ENCOUNTER — Ambulatory Visit (INDEPENDENT_AMBULATORY_CARE_PROVIDER_SITE_OTHER): Admitting: Gastroenterology

## 2023-05-26 VITALS — BP 112/74 | HR 55 | Temp 98.4°F | Ht 60.0 in | Wt 141.6 lb

## 2023-05-26 DIAGNOSIS — K625 Hemorrhage of anus and rectum: Secondary | ICD-10-CM

## 2023-05-26 DIAGNOSIS — Z1211 Encounter for screening for malignant neoplasm of colon: Secondary | ICD-10-CM

## 2023-05-26 DIAGNOSIS — Z800 Family history of malignant neoplasm of digestive organs: Secondary | ICD-10-CM | POA: Diagnosis not present

## 2023-05-26 DIAGNOSIS — I509 Heart failure, unspecified: Secondary | ICD-10-CM

## 2023-05-26 DIAGNOSIS — J441 Chronic obstructive pulmonary disease with (acute) exacerbation: Secondary | ICD-10-CM

## 2023-05-26 NOTE — Telephone Encounter (Signed)
   Name: Alison Kennedy  DOB: [birth year]  MRN: [id number]  Primary Cardiologist: Vishnu P Mallipeddi, MD  Chart reviewed as part of pre-operative protocol coverage. The patient has an upcoming visit scheduled with Odetta Benes, PA-C at which time clearance can be addressed in case there are any issues that would impact surgical recommendations.   I added preop FYI to appointment note so that provider is aware to address at time of outpatient visit.  Per office protocol the cardiology provider should forward their finalized clearance decision and recommendations regarding antiplatelet therapy to the requesting party below.    I will route this message as FYI to requesting party and remove this message from the preop box as separate preop APP input not needed at this time.   Please call with any questions.  Francene Ing, Retha Cast, NP  05/26/2023, 12:53 PM

## 2023-05-26 NOTE — Patient Instructions (Addendum)
 We are scheduling you for a colonoscopy in the near future with Dr. Mordechai Bralyn.  Will follow-up on your stool testing.  Please let me know if you have any further rectal bleeding.  We will plan to follow-up as needed after your colonoscopy.  It was a pleasure to see you today. I want to create trusting relationships with patients. If you receive a survey regarding your visit,  I greatly appreciate you taking time to fill this out on paper or through your MyChart. I value your feedback.  Julian Obey, MSN, FNP-BC, AGACNP-BC [facility]

## 2023-05-26 NOTE — Telephone Encounter (Signed)
  Request for patient to stop medication prior to procedure or is needing cleareance  05/26/23  Alison Kennedy 1965-08-01  What type of surgery is being performed? Colonoscopy  When is surgery scheduled? TBD  What type of clearance is required (medical or pharmacy to hold medication or both? BOTH  Patient is needing to have cardiac clearance and clearance to hold Eliquis  x 2 days prior to procedure.  Name of physician performing surgery?  Dr.Carver [facility] [employer]: [phone] Fax: [phone]  Anethesia type (none, local, MAC, general)? MAC

## 2023-05-27 ENCOUNTER — Other Ambulatory Visit (HOSPITAL_COMMUNITY)
Admission: RE | Admit: 2023-05-27 | Discharge: 2023-05-27 | Disposition: A | Discharge status: Home / Self Care | Source: Ambulatory Visit | Attending: Physician Assistant | Admitting: Physician Assistant

## 2023-05-27 ENCOUNTER — Other Ambulatory Visit: Payer: Self-pay

## 2023-05-27 DIAGNOSIS — K921 Melena: Secondary | ICD-10-CM | POA: Insufficient documentation

## 2023-05-27 LAB — OCCULT BLOOD X 1 CARD TO LAB, STOOL: Fecal Occult Bld: POSITIVE — AB

## 2023-05-27 NOTE — Telephone Encounter (Signed)
 Alison Kennedy with [facility] called in clarify what lab work pt needs to get today. Please advise.

## 2023-05-27 NOTE — Telephone Encounter (Signed)
 Pt returning call, requesting cb

## 2023-05-27 NOTE — Telephone Encounter (Signed)
 Spoke to [place] and verified labs that pt needs- CBC was drawn 5/21; fecal test was changed from Microbiology to FOBT (card) per phlebotomy.

## 2023-06-01 ENCOUNTER — Ambulatory Visit: Payer: Self-pay | Admitting: Physician Assistant

## 2023-06-08 NOTE — Progress Notes (Unsigned)
 Cardiology Office Note:  .   Date:  06/09/2023  ID:  Alison Kennedy, DOB [birth year], MRN [id number] PCP: Rosanna Comment, FNP  [facility] Providers Cardiologist:  Lasalle Pointer, MD {  History of Present Illness: .   Alison Kennedy is a 58 y.o. 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   Last seen in heartcare OV 05/05/2023 with me for 1 month follow-up.  At that time patient reported intermittent left side CP,  burning, 3/10 that has been present since 11/2022, not associated with exertion or position changes.  Also noted SOB only present with extreme exertion.  Started bisoprolol  2.5 mg. Per Dr. Mallipeddi request based on untreated PE from 11/24/2022 CTA, ordered VQ scan,  and started on Eliquis  5 mg twice daily.  Also scheduled for R/L cath due to atypical CP.  VQ scan 05/09/2023 showed chronic PE and patient was started on Eliquis  10 mg twice daily X 7 days then return to 5 mg twice daily dose.  Per Dr. Mallipeddi scheduled for CTA and cancel cardiac cath.  CTA 05/13/2023 suggested no acute PE and she was to continue with Eliquis  regimen.  Patient called on 05/25/2023 to notify office of streaks of blood in stool.  Follow-up CBC was normal and FOBT was positive.  She was continued on Eliquis  5 mg BID, which GI agreed.  Patient followed up with GI who recommended colonoscopy after cardiac preop clearance.   On interview, reports ongoing CP unchanged from 11/2022. Also noted SOB with over exertion, relieved with rest. Notes lightheaded when BP is low at home around SBP 100's. Report palpitations "skipping"  when laying down at night, lasting for 15 mins, occurs every night.  Note mild edema about 1-2 x per month. Denies syncope. Drinks about 1 liter of diet mountain dew every day. Not drinking much water daily.   She able to complete house work and do her own grocery shopping without any  concerns. Able to walk up flight of step. Admits to tobacco use, 1 ppd. Denies any drugs, ETOH.   Studies Reviewed: Aaron Aas   EKG Interpretation Date/Time:  Thursday June 09 2023 14:29:15 EDT Ventricular Rate:  71 PR Interval:  144 QRS Duration:  86 QT Interval:  436 QTC Calculation: 473 R Axis:   96  Text Interpretation: Sinus rhythm with occasional Premature ventricular complexes Rightward axis Prolonged QT When compared with ECG of 05-May-2023 15:08, No significant change was found Confirmed by Odetta Benes ([id number]) on 06/09/2023 3:28:29 PM   VQ perfusion IMPRESSION: Large perfusion defect within the RIGHT lower lobe consistent pulmonary embolism. Differential includes chronic or acute pulmonary embolism. No comparison available.   These results will be called to the ordering clinician or representative by the Radiologist Assistant, and communication documented in the PACS or [employer].  ECHO IMPRESSIONS 04/2023  1. Left ventricular ejection fraction, by estimation, is 40 to 45%. Left  ventricular ejection fraction by 3D volume is 48 %. The left ventricle has  mildly decreased function. The left ventricle demonstrates global  hypokinesis. Left ventricular diastolic   parameters are consistent with Grade I diastolic dysfunction (impaired  relaxation).   2. Right ventricular systolic function is normal. The right ventricular  size is normal. Tricuspid regurgitation signal is inadequate for assessing  PA pressure.   3. The mitral valve is normal in structure. No evidence of mitral valve  regurgitation. No evidence of mitral stenosis.   4. The aortic valve is tricuspid. Aortic valve regurgitation is not  visualized. No aortic stenosis is present.   5. The inferior vena cava is normal in size with greater than 50%  respiratory variability, suggesting right atrial pressure of 3 mmHg.   Comparison(s): No prior Echocardiogram.    CTA 11/2022 IMPRESSION:  CHRONIC APPEARING  NONOCCLUSIVE SUBSEGMENTAL PULMONARY EMBOLISM WITHIN THE RIGHT POSTERIOR BASAL SEGMENT.  CONSTELLATION OF FINDINGS SUSPICIOUS FOR CONGESTIVE HEART FAILURE. SMALL VOLUME OF ASCITES IN THE UPPER ABDOMEN.    Physical Exam:   VS:  BP 90/78 (BP Location: Left Arm)   Pulse 71   Ht 5' (1.524 m)   Wt 142 lb (64.4 kg)   SpO2 94%   BMI 27.73 kg/m    Wt Readings from Last 3 Encounters:  06/09/23 142 lb (64.4 kg)  05/26/23 141 lb 9.6 oz (64.2 kg)  05/05/23 136 lb 12.8 oz (62.1 kg)    GEN: Sitting in chair in no acute distress NECK: No JVD; No carotid bruits CARDIAC: Regular with ectopy, no murmurs, rubs, gallops RESPIRATORY:  Clear to auscultation without rales, wheezing or rhonchi  ABDOMEN: Soft, non-tender, non-distended EXTREMITIES:  No edema; No deformity   ASSESSMENT AND PLAN: .   Preoperative Cardiovascular Risk Assessment Revised Cardiac Risk Index (RCRI) with 1 point and perioperative risk of major cardiac events is 0.9%.  Duke Activity Status Index (DASI) with > 4 mets .  From cardiac standpoint, this patient has a low cardiac risk. Therefore, based on ACC/AHA guidelines, patient at acceptable risk for the planned procedure without further cardiovascular testing.  Hold Eliquis  2 day prior to procedure. Per discussion with Dr. Mallipeddi and pharmacy, no need for Lovenox bridge.   Chronic PE  Per Dr. Mallipeddi request based on untreated PE from 11/24/2022 CTA,  started on Eliquis  5 mg twice daily and ordered VQ scan. VQ scan 05/09/2023 showed PE and patient was started on Eliquis  10 mg twice daily X 7 days then return to 5 mg twice daily dose.  Per Dr. Mallipeddi, schedule for CTA and cancel cardiac cath.  CTA 05/13/2023 suggested no acute PE and she was to continue with Eliquis  regimen.  Patient called on 05/25/2023 to notify office of streaks of blood in stool.  Follow-up CBC was normal and FOBT was positive.  She was continued on Eliquis  5 mg BID, which GI agreed.  Patient followed up with  GI who recommended colonoscopy after cardiac preop clearance.  Noted SOB with over exertion, relieved with rest. No tachycardia, tachypnea or hypoxic on exam. Continue with Eliquis  5 mg BID except hold as above.   HFmrEF  04/2023 ECHO: EF 40-45%, mildly decreased LV function, global hypokinesis, G1DD. No previous ECHO for comparison.  Per Dr. Mallipeddi, scheduled for cath 05/12/2023 with [facility] but had to cancel due to positive VQ scan and just focusing on PE treatment. See below. Per Dr. Mallipeddi, hold off on rescheduling cath for now. Reassess symptoms at next follow up in 3-4 months to determine if cath is needed.  Noted SOB with over exertion, relieved with rest. Noted lightheaded when BP is low at home around SBP 100's. Discussed importance of daily weights. Contact office if + 2-3 lbs in one day or 5 lbs in one week.  Decreased lasix  to prn with edema or weight changes.  Continue on daily doses of Valsartan  40 mg, Bisoprolol  2.5 mg  Discussed SGLT2i. No hx of UTIs. Ordered Jardiance 10 mg.  Ordered ECHO 09/2023 BP log x 3 weeks.    PVC EKG today with PVC. See above.  Report palpitations "skipping"  when laying down at night, lasting for 15 mins, occurs every night.  Continue bisoprolol  as above.  Ordered Zio monitor for 14 days.  Encouraged to reduced caffeine intake and increase water intake.    Atypical CP  Reported ongoing intermittent left side chest wall burning pain, 3/10, lasting few minutes, that has been present since 11/2022, not associated with exertion or position changes.  No ischemic changes noted on EKG in the interim. PVC noted today as above.  Less concern for ischemia with presentation, no EKG changes, and Chronic PE.  Per Dr. Mallipeddi, plan to reassess in 3-4 month the need for cath    HLD  04/2023: LDL 157. Started on Crestor  20 mg  Continue Crestor  20 mg.  Ordered FLP and LFT in 3 weeks.    Tobacco use  1 PPD since 58 y/o.  Cessation encouraged but not  interested at this time. Tried to patches and gums previously but unsuccessful.     Dispo: Ordered Jardiance 10 mg daily. Decreased Lasix  to prn. BP Log x3 weeks. Follow up labs in 3 weeks (FLP/CMP), 14 day zio monitor, Ordered Echo for 09/2023, Follow up in 10/2023  Signed, Metta Actis, PA-C

## 2023-06-09 ENCOUNTER — Ambulatory Visit: Attending: Physician Assistant | Admitting: Physician Assistant

## 2023-06-09 ENCOUNTER — Ambulatory Visit: Attending: Physician Assistant

## 2023-06-09 ENCOUNTER — Encounter: Payer: Self-pay | Admitting: Physician Assistant

## 2023-06-09 VITALS — BP 90/78 | HR 71 | Ht 60.0 in | Wt 142.0 lb

## 2023-06-09 DIAGNOSIS — E785 Hyperlipidemia, unspecified: Secondary | ICD-10-CM | POA: Insufficient documentation

## 2023-06-09 DIAGNOSIS — I493 Ventricular premature depolarization: Secondary | ICD-10-CM | POA: Diagnosis not present

## 2023-06-09 DIAGNOSIS — Z720 Tobacco use: Secondary | ICD-10-CM | POA: Insufficient documentation

## 2023-06-09 DIAGNOSIS — R0789 Other chest pain: Secondary | ICD-10-CM | POA: Insufficient documentation

## 2023-06-09 DIAGNOSIS — I2699 Other pulmonary embolism without acute cor pulmonale: Secondary | ICD-10-CM | POA: Diagnosis not present

## 2023-06-09 DIAGNOSIS — Z01810 Encounter for preprocedural cardiovascular examination: Secondary | ICD-10-CM | POA: Insufficient documentation

## 2023-06-09 DIAGNOSIS — I5022 Chronic systolic (congestive) heart failure: Secondary | ICD-10-CM | POA: Diagnosis not present

## 2023-06-09 MED ORDER — FUROSEMIDE 20 MG PO TABS: 20.0000 mg | ORAL_TABLET | Freq: Every day | ORAL | 3 refills | Status: AC | PRN

## 2023-06-09 MED ORDER — EMPAGLIFLOZIN 10 MG PO TABS: 10.0000 mg | ORAL_TABLET | Freq: Every day | ORAL | 11 refills | Status: AC

## 2023-06-09 NOTE — Patient Instructions (Addendum)
 Medication Instructions:   Your physician has recommended you make the following change in your medication:   Start Jardiance 10 mg Daily  Change Lasix  to 20 mg as needed   Hold Eliquis  2 day prior to Colonoscopy   Your provider would like you to monitor your blood pressure and record for 3 weeks and drop off or send a MyChart message.   *If you need a refill on your cardiac medications before your next appointment, please call your pharmacy*  Lab Work: Your physician recommends that you return for lab work in: 3 Weeks Fasting ( CMP, FLP)   If you have labs (blood work) drawn today and your tests are completely normal, you will receive your results only by: MyChart Message (if you have MyChart) OR A paper copy in the mail If you have any lab test that is abnormal or we need to change your treatment, we will call you to review the results.  Testing/Procedures: Your physician has requested that you have an echocardiogram. Echocardiography is a painless test that uses sound waves to create images of your heart. It provides your doctor with information about the size and shape of your heart and how well your heart's chambers and valves are working. This procedure takes approximately one hour. There are no restrictions for this procedure. Please do NOT wear cologne, perfume, aftershave, or lotions (deodorant is allowed). Please arrive 15 minutes prior to your appointment time.  Please note: We ask at that you not bring children with you during ultrasound (echo/ vascular) testing. Due to room size and safety concerns, children are not allowed in the ultrasound rooms during exams. Our front office staff cannot provide observation of children in our lobby area while testing is being conducted. An adult accompanying a patient to their appointment will only be allowed in the ultrasound room at the discretion of the ultrasound technician under special circumstances. We apologize for any  inconvenience.  ZIO XT- Long Term Monitor Instructions   Your physician has requested you wear your ZIO patch monitor___14__days.   This is a single patch monitor.  Ir[employer] supplies one patch monitor per enrollment.  Additional stickers are not available.   Please do not apply patch if you will be having a Nuclear Stress Test, Echocardiogram, Cardiac CT, MRI, or Chest Xray during the time frame you would be wearing the monitor. The patch cannot be worn during these tests.  You cannot remove and re-apply the ZIO XT patch monitor.   Your ZIO patch monitor will be sent USPS Priority mail from [facility] directly to your home address. The monitor may also be mailed to a PO BOX if home delivery is not available.   It may take 3-5 days to receive your monitor after you have been enrolled.   Once you have received you monitor, please review enclosed instructions.  Your monitor has already been registered assigning a specific monitor serial # to you.   Applying the monitor   Shave hair from upper left chest.   Hold abrader disc by orange tab.  Rub abrader in 40 strokes over left upper chest as indicated in your monitor instructions.   Clean area with 4 enclosed alcohol pads .  Use all pads to assure are is cleaned thoroughly.  Let dry.   Apply patch as indicated in monitor instructions.  Patch will be place under collarbone on left side of chest with arrow pointing upward.   Rub patch adhesive wings for 2 minutes.Remove white label  marked "1".  Remove white label marked "2".  Rub patch adhesive wings for 2 additional minutes.   While looking in a mirror, press and release button in center of patch.  A small green light will flash 3-4 times .  This will be your only indicator the monitor has been turned on.     Do not shower for the first 24 hours.  You may shower after the first 24 hours.   Press button if you feel a symptom. You will hear a small click.  Record Date, Time and Symptom  in the Patient Log Book.   When you are ready to remove patch, follow instructions on last 2 pages of Patient Log Book.  Stick patch monitor onto last page of Patient Log Book.   Place Patient Log Book in [place] box.  Use locking tab on box and tape box closed securely.  The [employer] and [employer] has [employer] on it.  Please place in mailbox as soon as possible.  Your physician should have your test results approximately 7 days after the monitor has been mailed back to [facility].   Call [facility] Customer Care at [phone] if you have questions regarding your ZIO XT patch monitor.  Call them immediately if you see an orange light blinking on your monitor.   If your monitor falls off in less than 4 days contact our Monitor department at [phone].  If your monitor becomes loose or falls off after 4 days call [employer] at [phone] for suggestions on securing your monitor.    Follow-Up: At [facility], you and your health needs are our priority.  As part of our continuing mission to provide you with exceptional heart care, our providers are all part of one team.  This team includes your primary Cardiologist (physician) and Advanced Practice Providers or APPs (Physician Assistants and Nurse Practitioners) who all work together to provide you with the care you need, when you need it.  Your next appointment:    October 2025   Provider:   You may see Vishnu P Mallipeddi, MD or one of the following Advanced Practice Providers on your designated Care Team:   [place], PA-C  Scotesia Edison, [place] Theotis Flake, [place]     We recommend signing up for the patient portal called "MyChart".  Sign up information is provided on this After Visit Summary.  MyChart is used to connect with patients for Virtual Visits (Telemedicine).  Patients are able to view lab/test results, encounter notes, upcoming appointments, etc.  Non-urgent messages can be sent to your provider as  well.   To learn more about what you can do with MyChart, go to [email or web address].   Other Instructions Thank you for choosing [facility]!

## 2023-06-10 NOTE — Telephone Encounter (Signed)
 Cardiology notes reviewed. Okay to schedule procedure with eliquis  hold for 2 days.

## 2023-06-13 ENCOUNTER — Other Ambulatory Visit: Payer: Self-pay | Admitting: *Deleted

## 2023-06-13 ENCOUNTER — Encounter: Payer: Self-pay | Admitting: *Deleted

## 2023-06-13 MED ORDER — PEG 3350-KCL-NA BICARB-NACL 420 G PO SOLR: 4000.0000 mL | Freq: Once | ORAL | 0 refills | Status: AC

## 2023-06-13 NOTE — Telephone Encounter (Signed)
 Pt has been scheduled for 07/11/23. Instructions mailed and prep sent to the pharmacy.

## 2023-06-13 NOTE — Telephone Encounter (Signed)
 LMOVM to return call.

## 2023-06-13 NOTE — Telephone Encounter (Signed)
 [employer] PA:  Thank you for your online prior authorization/notification submission.  The prior authorization/notification reference number is: [id number]. The prior authorization/notification case information was transmitted on 06/13/2023 at 10:55 AM CDT

## 2023-06-14 NOTE — Telephone Encounter (Signed)
 [employer] PA:  Covered/Approved, authorization # [id number] DOS: 07/11/23-10/09/23

## 2023-06-17 ENCOUNTER — Ambulatory Visit: Admitting: Family Medicine

## 2023-06-17 ENCOUNTER — Other Ambulatory Visit (HOSPITAL_COMMUNITY)
Admission: RE | Admit: 2023-06-17 | Discharge: 2023-06-17 | Disposition: A | Discharge status: Home / Self Care | Source: Ambulatory Visit | Attending: Family Medicine | Admitting: Family Medicine

## 2023-06-17 VITALS — BP 98/70 | HR 65 | Wt 144.0 lb

## 2023-06-17 DIAGNOSIS — Z124 Encounter for screening for malignant neoplasm of cervix: Secondary | ICD-10-CM | POA: Diagnosis not present

## 2023-06-17 NOTE — Assessment & Plan Note (Signed)
 Pap smear done, Patient tolerated procedure well. Informed patient I will keep them updated on results. Right breast normal without mass, skin or nipple changes or axillary nodes, left breast normal without mass, skin or nipple changes or axillary nodes.

## 2023-06-17 NOTE — Addendum Note (Signed)
 Addended by: Rosanna Comment on: 06/17/2023 03:30 PM   Modules accepted: Orders

## 2023-06-17 NOTE — Patient Instructions (Signed)

## 2023-06-17 NOTE — Progress Notes (Signed)
   Established Patient Office Visit   Subjective  Patient ID: Alison Kennedy, female    DOB: May 25, 1965  Age: 58 y.o. MRN: [id number]  Chief Complaint  Patient presents with   Gynecologic Exam    She  has a past medical history of Alcohol abuse, COPD (chronic obstructive pulmonary disease) (HCC), Depression, Heart failure (HCC), Heart murmur, and Hyperlipidemia.  HPI Patient presents to the clinic for cervical caner screening. For the details of today's visit, please refer to assessment and plan.   Review of Systems  Constitutional:  Negative for chills and fever.  Respiratory:  Negative for shortness of breath.   Cardiovascular:  Negative for chest pain.  Genitourinary:  Negative for dysuria.  Neurological:  Negative for dizziness and headaches.      Objective:     BP (!) 98/50 (BP Location: Left Arm, Patient Position: Sitting, Cuff Size: Large)   Pulse 65   Wt 144 lb 0.6 oz (65.3 kg)   SpO2 90%   BMI 28.13 kg/m  BP Readings from Last 3 Encounters:  06/17/23 98/70  06/09/23 90/78  05/26/23 112/74      Physical Exam Vitals reviewed.  Constitutional:      General: She is not in acute distress.    Appearance: Normal appearance. She is not ill-appearing, toxic-appearing or diaphoretic.  HENT:     Head: Normocephalic.   Eyes:     General:        Right eye: No discharge.        Left eye: No discharge.     Conjunctiva/sclera: Conjunctivae normal.    Cardiovascular:     Rate and Rhythm: Normal rate.     Pulses: Normal pulses.     Heart sounds: Normal heart sounds.  Pulmonary:     Effort: Pulmonary effort is normal. No respiratory distress.     Breath sounds: Normal breath sounds.  Abdominal:     General: Bowel sounds are normal.     Palpations: Abdomen is soft.  Genitourinary:    General: Normal vulva.     Exam position: Lithotomy position.     Vagina: Normal.     Cervix: Normal.   Skin:    General: Skin is warm and dry.     Capillary Refill:  Capillary refill takes less than 2 seconds.   Neurological:     Mental Status: She is alert.   Psychiatric:        Mood and Affect: Mood normal.        Behavior: Behavior normal.      No results found for any visits on 06/17/23.  The 10-year ASCVD risk score (Arnett DK, et al., 2019) is: 4.2%    Assessment & Plan:  Cervical cancer screening Assessment & Plan: Pap smear done, Patient tolerated procedure well. Informed patient I will keep them updated on results. Right breast normal without mass, skin or nipple changes or axillary nodes, left breast normal without mass, skin or nipple changes or axillary nodes.     Return in about 6 months (around 12/17/2023), or if symptoms worsen or fail to improve, for chronic follow-up.   Avelino Lek Amber Bail, FNP

## 2023-06-19 NOTE — Progress Notes (Unsigned)
 Alison Kennedy, female    DOB: [birth year]    MRN: [id number]   Brief patient profile:  85  yowf  active smoker  referred to pulmonary clinic in [place]  12/23/2022 by Alison Gibble NP  in [place]  for COPD f/u  arrived back in [place] Nov 2024   Spirometry 03/19/2011    - Fev1 1.11L/46%, FVC 1.67L/58%, Ratio 0. 67   History of Present Illness  12/23/2022  Pulmonary/ 1st Kennedy eval/ Alison Kennedy / [place] Kennedy  Chief Complaint  Patient presents with   Establish Care   Shortness of Breath  Dyspnea:  food lion ok / no hc  Cough:  minimal dry  Sleep: bed is flat one pillow and awakens 3/7 nights due to cough  SABA use: avg 2 in 24 h  02: none  LDSCT:  CTa  11/24/22 neg nodules  Rec Plan A = Automatic = Always=    Anoro or Trelegy 200 one click  each am :  really tug on it to get all the medication out then rinse and gargle. Work on inhaler technique: Plan B = Backup (to supplement plan A, not to replace it) Only use your albuterol  inhaler as a rescue medication I will see what I can do to expedite cardiology follow up so we can get you on the right meds.  If condition worsens go to [facility] hosp   Please schedule a follow up Kennedy visit in 12 weeks, call sooner if needed with all medications /inhalers/ solutions in hand    03/14/2023  f/u ov/Alison Kennedy/Alison Kennedy re: GOLD 3  maint on trelegy 200  did not  bring meds / on ACEi  Chief Complaint  Patient presents with   Follow-up    Follow up , having some light headiness   Dyspnea:  can do food lion slow pace  Cough: worse p supper non productive  Sleeping: flat bed one pillow  wakes once q night  with cough close to bedtime  SABA use: once a day s pattern  02: none  Rec Stop lisinopril and replace with valsartan  40 mg one daily  The key is to stop smoking completely before smoking completely stops you! Change to Trelegy 100 one click each am   CTa 05/23/23 1. No evidence of pulmonary embolism to the segmental  level. 2. Dilated main pulmonary arteries, right atrium, and reflux of contrast agent into the hepatic veins suggestive of pulmonary arterial hypertension. 3. Postsu rgical changes from prior median sternotomy.  06/20/2023  f/u ov/[place] Kennedy/Alison Kennedy re: COPD GOLD 3   maint on advair 250  / still smoking  Chief Complaint  Patient presents with   Follow-up    Shob - doe   COPD  Dyspnea:  food lion slow pace one aisle  Cough: after supper but not hs - worse with trelegy/ prefers advair   Sleeping: flat bed inhaler    resp cc  SABA use: late pm seems to help cough  02: none    No obvious day to day or daytime variability or assoc excess/ purulent sputum or mucus plugs or hemoptysis or cp or chest tightness, subjective wheeze or overt sinus or hb symptoms.    Also denies any obvious fluctuation of symptoms with weather or environmental changes or other aggravating or alleviating factors except as outlined above   No unusual exposure hx or h/o childhood pna/ asthma or knowledge of premature birth.  Current Allergies, Complete Past Medical History, Past Surgical History, Family  History, and Social History were reviewed in [employer] record.  ROS  The following are not active complaints unless bolded Hoarseness, sore throat, dysphagia, dental problems, itching, sneezing,  nasal congestion or discharge of excess mucus or purulent secretions, ear ache,   fever, chills, sweats, unintended wt loss or wt gain, classically pleuritic or exertional cp,  orthopnea pnd or arm/hand swelling  or leg swelling, presyncope, palpitations, abdominal pain, anorexia, nausea, vomiting, diarrhea  or change in bowel habits or change in bladder habits, change in stools or change in urine, dysuria, hematuria,  rash, arthralgias, visual complaints, headache, numbness, weakness or ataxia or problems with walking or coordination,  change in mood or  memory.        Current Meds  Medication  Sig   albuterol  (VENTOLIN  HFA) 108 (90 Base) MCG/ACT inhaler Inhale 2 puffs into the lungs every 6 (six) hours as needed for wheezing or shortness of breath.   apixaban  (ELIQUIS ) 5 MG TABS tablet Take 1 tablet (5 mg total) by mouth 2 (two) times daily.   bisoprolol  (ZEBETA ) 5 MG tablet Take 0.5 tablets (2.5 mg total) by mouth daily.   Cholecalciferol (VITAMIN D -3) 125 MCG (5000 UT) TABS Take 5,000 Units by mouth daily.   fluticasone -salmeterol (ADVAIR) 250-50 MCG/ACT AEPB Inhale 1 puff into the lungs every 12 (twelve) hours.   furosemide  (LASIX ) 20 MG tablet Take 1 tablet (20 mg total) by mouth daily as needed.   rosuvastatin  (CRESTOR ) 20 MG tablet Take 1 tablet (20 mg total) by mouth daily.   traZODone  (DESYREL ) 50 MG tablet Take 1 tablet (50 mg total) by mouth at bedtime as needed.   valsartan  (DIOVAN ) 40 MG tablet Take 1 tablet (40 mg total) by mouth daily.               Past Medical History:  Diagnosis Date   Alcohol abuse    Depression    Heart murmur       Objective:    Wts  06/20/2023       143   03/14/23 133 lb (60.3 kg)  12/23/22 134 lb (60.8 kg)  07/03/17 138 lb (62.6 kg)    Vital signs reviewed  06/20/2023  - Note at rest 02 sats  93% on RA   General appearance:    amb somber wf nad    HEENT : Oropharynx clear/ dentition poor   Nasal turbinates nl    NECK :  without  apparent JVD/ palpable Nodes/TM    LUNGS: no acc muscle use,  Mild barrel  contour chest wall with bilateral  Distant bs s audible wheeze and  without cough on insp or exp maneuvers  and mild  Hyperresonant  to  percussion bilaterally     CV:  RRR  no s3 or murmur or increase in P2, and no edema   ABD:  soft and nontender with pos end  insp Hoover's  in the supine position.  No bruits or organomegaly appreciated   MS:  Nl gait/ ext warm without deformities Or obvious joint restrictions  calf tenderness, cyanosis or clubbing     SKIN: warm and dry without lesions    NEURO:  alert, approp,  nl sensorium with  no motor or cerebellar deficits apparent.            Assessment

## 2023-06-20 ENCOUNTER — Other Ambulatory Visit (HOSPITAL_COMMUNITY): Payer: Self-pay

## 2023-06-20 ENCOUNTER — Ambulatory Visit (INDEPENDENT_AMBULATORY_CARE_PROVIDER_SITE_OTHER): Admitting: Internal Medicine

## 2023-06-20 ENCOUNTER — Encounter: Payer: Self-pay | Admitting: Internal Medicine

## 2023-06-20 ENCOUNTER — Telehealth: Payer: Self-pay

## 2023-06-20 ENCOUNTER — Telehealth: Payer: Self-pay | Admitting: Internal Medicine

## 2023-06-20 VITALS — BP 110/68 | HR 57 | Ht 60.0 in | Wt 143.2 lb

## 2023-06-20 DIAGNOSIS — J449 Chronic obstructive pulmonary disease, unspecified: Secondary | ICD-10-CM

## 2023-06-20 DIAGNOSIS — F17210 Nicotine dependence, cigarettes, uncomplicated: Secondary | ICD-10-CM

## 2023-06-20 NOTE — Telephone Encounter (Signed)
 LVM for patient regarding the 07/26/23 11:30 am PFT appointment at [facility] time is 11:15 am 1st floor registration desk---will mail information to patient and requested call back with questions or concerns

## 2023-06-20 NOTE — Telephone Encounter (Signed)
 Pharmacy Patient Advocate Encounter   Received notification from CoverMyMeds that prior authorization for Fluticasone -Salmeterol 250-50MCG/ACT aerosol powder  is required/requested.   Insurance verification completed.   The patient is insured through [facility] .   Per test claim:  Brand Advair Diskus is preferred by the insurance.  If suggested medication is appropriate, Please send in a new RX and discontinue this one. If not, please advise as to why it's not appropriate so that we may request a Prior Authorization. Please note, some preferred medications may still require a PA.  If the suggested medications have not been trialed and there are no contraindications to their use, the PA will not be submitted, as it will not be approved.   *called patient pharmacy and l/m to bill for preferred Brand medication

## 2023-06-20 NOTE — Assessment & Plan Note (Signed)
 Active smoker - Spirometry 03/19/2011  fev1 1.11L/46%, FVC 1.67L/58%, Ratio 67 - 12/23/2022  After extensive coaching inhaler device,  effectiveness =    hfa 25% and DPI 50% so try trelegy 200 samples x 12 weeks while awaiting medicaid approval.  - 03/14/2023 change to trelegy 100 each am and try off acei > made her cough  - 06/20/2023  After extensive coaching inhaler device,  effectiveness =    50% with hfa so return in 6 weeks with pfts and consider change to sym 160/spiriva as insurance won't cover bretri  - 06/20/2023   Walked on RA  x  3  lap(s) =  approx 450  ft  @ mod pace, stopped due to end of stud  with lowest 02 sats 94% and mild sob last lap   Group D (now reclassified as E) in terms of symptom/risk and laba/lama/ICS  therefore appropriate rx at this point >>>  trelegy but prefers advair for now and consider above modifications when returns for fresh set of pfts  In meantime should use saba more appro for reproducible/ predictale doe from copd:  Rec:  try albuterol  15 min before an activity (on alternating days)  that you know would usually make you short of breath and see if it makes any difference and if makes none then don't take albuterol  after activity unless you can't catch your breath as this means it's the resting that helps, not the albuterol .

## 2023-06-20 NOTE — Patient Instructions (Addendum)
 The key is to stop smoking completely before smoking completely stops you!  Plan A = Automatic = Always=    Advair 250 one first thing in am and 12 hours later   Plan B = Backup (to supplement plan A, not to replace it) Only use your albuterol  inhaler as a backup  medication to be used if you can't catch your breath by resting or doing a relaxed purse lip breathing pattern. - also to use for cough  - The less you use it, the better it will work when you need it. - Ok to use the inhaler up to 2 puffs  every 4 hours if you must but call for appointment if use goes up over your usual need - Don't leave home without it !!  (think of it like the spare tire for your car)   Also  Ok to try albuterol  15 min before an activity (on alternating days)  that you know would usually make you short of breath and see if it makes any difference and if makes none then don't take albuterol  after activity unless you can't catch your breath as this means it's the resting that helps, not the albuterol .   Please schedule a follow up office visit in 6 weeks, call sooner if needed with all respiratory medications /inhalers/ solutions in hand so we can verify exactly what you are taking. This includes all medications from all doctors and over the counters   PFTs on return - don't use your inhalers the morning of the test

## 2023-06-22 LAB — CYTOLOGY - PAP
Comment: NEGATIVE
Diagnosis: NEGATIVE
High risk HPV: NEGATIVE

## 2023-06-22 NOTE — Assessment & Plan Note (Signed)
 Counseled re importance of smoking cessation but did not meet time criteria for separate billing    Low-dose CT lung cancer screening is recommended for patients who are 59-59 years of age with a 20+ pack-year history of smoking and who are currently smoking or quit <=15 years ago. No coughing up blood  No unintentional weight loss of > 15 pounds in the last 6 months - pt is eligible for scanning yearly until 52 y p quits but already had neg CTa 05/2023 so no need to start until 05/2024 - advised  Each maintenance medication was reviewed in detail including emphasizing most importantly the difference between maintenance and prns and under what circumstances the prns are to be triggered using an action plan format where appropriate.  Total time for H and P, chart review, counseling, reviewing hfa/dpi device(s) , directly observing portions of ambulatory 02 saturation study/ and generating customized AVS unique to this office visit / same day charting = 31 min

## 2023-06-28 ENCOUNTER — Ambulatory Visit: Payer: Self-pay | Admitting: Family Medicine

## 2023-06-28 NOTE — Progress Notes (Signed)
 Please inform patient,    Your Pap smear results are normal. Next Pap smear will be in 3 years.

## 2023-06-29 ENCOUNTER — Telehealth: Payer: Self-pay

## 2023-06-29 NOTE — Telephone Encounter (Signed)
 Copied from CRM [id number]. Topic: Clinical - Lab/Test Results >> Jun 29, 2023  9:34 AM Larissa RAMAN wrote: Reason for CRM: Patient returning missed call regarding lab results. Relayed results per providers note, verbatim. Patient verbalized understanding, no additional questions.

## 2023-06-29 NOTE — Telephone Encounter (Signed)
 Noted

## 2023-07-04 ENCOUNTER — Telehealth: Payer: Self-pay | Admitting: Pharmacy Technician

## 2023-07-04 ENCOUNTER — Telehealth: Payer: Self-pay | Admitting: Physician Assistant

## 2023-07-04 NOTE — Telephone Encounter (Signed)
 Pharmacy Patient Advocate Encounter  Received notification from [facility] Medicaid that Prior Authorization for jardiance  10mg  has been APPROVED from 07/04/23 to 07/03/24. Spoke to pharmacy to process.Copay is $4.00.    PA #/Case ID/Reference #: [id number]

## 2023-07-04 NOTE — Telephone Encounter (Signed)
*  STAT* If patient is at the pharmacy, call can be transferred to refill team.   1. Which medications need to be refilled? (please list name of each medication and dose if known)  new prescription for Jardiance    2. Would you like to learn more about the convenience, safety, & potential cost savings by using the [facility]?    3. Are you open to using the [facility] (Type [facility].    4. Which pharmacy/location (including street and city if local pharmacy) is medication to be sent to?[facility]  [place]   5. Do they need a 30 day or 90 day supply? 30 days and refills

## 2023-07-04 NOTE — Telephone Encounter (Signed)
 Pharmacy Patient Advocate Encounter   Received notification from Onbase that prior authorization for JARDIANCE  10MG  is required/requested.   Insurance verification completed.   The patient is insured through [facility] MEDICAID .   Per test claim: PA required; PA submitted to above mentioned insurance via CoverMyMeds Key/confirmation #/EOC [facility] Status is pending

## 2023-07-05 ENCOUNTER — Ambulatory Visit: Payer: Self-pay | Admitting: Physician Assistant

## 2023-07-05 ENCOUNTER — Other Ambulatory Visit (HOSPITAL_COMMUNITY)
Admission: RE | Admit: 2023-07-05 | Discharge: 2023-07-05 | Disposition: A | Discharge status: Home / Self Care | Source: Ambulatory Visit | Attending: Physician Assistant | Admitting: Physician Assistant

## 2023-07-05 DIAGNOSIS — E785 Hyperlipidemia, unspecified: Secondary | ICD-10-CM | POA: Insufficient documentation

## 2023-07-05 LAB — COMPREHENSIVE METABOLIC PANEL WITH GFR
ALT: 33 U/L (ref 0–44)
AST: 25 U/L (ref 15–41)
Albumin: 3.9 g/dL (ref 3.5–5.0)
Alkaline Phosphatase: 75 U/L (ref 38–126)
Anion gap: 10 (ref 5–15)
BUN: 21 mg/dL — ABNORMAL HIGH (ref 6–20)
CO2: 26 mmol/L (ref 22–32)
Calcium: 9 mg/dL (ref 8.9–10.3)
Chloride: 102 mmol/L (ref 98–111)
Creatinine, Ser: 0.8 mg/dL (ref 0.44–1.00)
GFR, Estimated: >60 mL/min (ref >60)
Glucose, Bld: 103 mg/dL — ABNORMAL HIGH (ref 70–99)
Potassium: 4.8 mmol/L (ref 3.5–5.1)
Sodium: 138 mmol/L (ref 135–145)
Total Bilirubin: 0.7 mg/dL (ref 0.0–1.2)
Total Protein: 6.7 g/dL (ref 6.5–8.1)

## 2023-07-05 LAB — LIPID PANEL
Cholesterol: 105 mg/dL (ref 0–200)
HDL: 46 mg/dL (ref >40)
LDL Cholesterol: 49 mg/dL (ref 0–99)
Total CHOL/HDL Ratio: 2.3 ratio
Triglycerides: 52 mg/dL (ref <150)
VLDL: 10 mg/dL (ref 0–40)

## 2023-07-06 ENCOUNTER — Ambulatory Visit: Payer: Self-pay | Admitting: Physician Assistant

## 2023-07-06 ENCOUNTER — Encounter: Payer: Self-pay | Admitting: Physician Assistant

## 2023-07-06 DIAGNOSIS — I493 Ventricular premature depolarization: Secondary | ICD-10-CM

## 2023-07-07 ENCOUNTER — Other Ambulatory Visit: Payer: Self-pay

## 2023-07-07 ENCOUNTER — Encounter (HOSPITAL_COMMUNITY)
Admission: RE | Admit: 2023-07-07 | Discharge: 2023-07-07 | Disposition: A | Discharge status: Home / Self Care | Source: Ambulatory Visit | Attending: Internal Medicine | Admitting: Internal Medicine

## 2023-07-07 ENCOUNTER — Encounter (HOSPITAL_COMMUNITY): Payer: Self-pay

## 2023-07-07 NOTE — Patient Instructions (Signed)
 Alison Kennedy  07/07/2023     @PREFPERIOPPHARMACY @   Your procedure is scheduled on  07/11/2023.   Report to [facility] at  0945 A.M.   Call this number if you have problems the morning of surgery:  [phone]  If you experience any cold or flu symptoms such as cough, fever, chills, shortness of breath, etc. between now and your scheduled surgery, please notify us  at the above number.   Remember:        Your last dose of jardiance  should be on 07/07/2023.        Your last dose of eliquis  should be on 07/08/2023.        Use your inhalers before you come and bring your rescue inhaler with you.   Follow the diet and prep instructions given to you by the office.   You may drink clear liquids until 0745 am on 07/11/2023.     Clear liquids allowed are:                    Water, Juice (No red color; non-citric and without pulp; diabetics please choose diet or no sugar options), Carbonated beverages (diabetics please choose diet or no sugar options), Clear Tea (No creamer, milk, or cream, including half & half and powdered creamer), Black Coffee Only (No creamer, milk or cream, including half & half and powdered creamer), and Clear Sports drink (No red color; diabetics please choose diet or no sugar options)    Take these medicines the morning of surgery with A SIP OF WATER                                                  bisoprolol .    Do not wear jewelry, make-up or nail polish, including gel polish,  artificial nails, or any other type of covering on natural nails (fingers and  toes).  Do not wear lotions, powders, or perfumes, or deodorant.  Do not shave 48 hours prior to surgery.  Men may shave face and neck.  Do not bring valuables to the hospital.  [facility] is not responsible for any belongings or valuables.  Contacts, dentures or bridgework may not be worn into surgery.  Leave your suitcase in the car.  After surgery it may be brought to your room.  For patients  admitted to the hospital, discharge time will be determined by your treatment team.  Patients discharged the day of surgery will not be allowed to drive home and must have someone with them for 24 hours.    Special instructions:   DO NOT smoke tobacco or vape for 24 hours before your procedure.  Please read over the following fact sheets that you were given. Anesthesia Post-op Instructions and Care and Recovery After Surgery      Colonoscopy, Adult, Care After The following information offers guidance on how to care for yourself after your procedure. Your health care provider may also give you more specific instructions. If you have problems or questions, contact your health care provider. What can I expect after the procedure? After the procedure, it is common to have: A small amount of blood in your stool for 24 hours after the procedure. Some gas. Mild cramping or bloating of your abdomen. Follow these instructions at home: Eating and drinking  Drink enough fluid to keep your urine pale yellow. Follow instructions from your health care provider about eating or drinking restrictions. Resume your normal diet as told by your health care provider. Avoid heavy or fried foods that are hard to digest. Activity Rest as told by your health care provider. Avoid sitting for a long time without moving. Get up to take short walks every 1-2 hours. This is important to improve blood flow and breathing. Ask for help if you feel weak or unsteady. Return to your normal activities as told by your health care provider. Ask your health care provider what activities are safe for you. Managing cramping and bloating  Try walking around when you have cramps or feel bloated. If directed, apply heat to your abdomen as told by your health care provider. Use the heat source that your health care provider recommends, such as a moist heat pack or a heating pad. Place a towel between your skin and the heat  source. Leave the heat on for 20-30 minutes. Remove the heat if your skin turns bright red. This is especially important if you are unable to feel pain, heat, or cold. You have a greater risk of getting burned. General instructions If you were given a sedative during the procedure, it can affect you for several hours. Do not drive or operate machinery until your health care provider says that it is safe. For the first 24 hours after the procedure: Do not sign important documents. Do not drink alcohol. Do your regular daily activities at a slower pace than normal. Eat soft foods that are easy to digest. Take over-the-counter and prescription medicines only as told by your health care provider. Keep all follow-up visits. This is important. Contact a health care provider if: You have blood in your stool 2-3 days after the procedure. Get help right away if: You have more than a small spotting of blood in your stool. You have large blood clots in your stool. You have swelling of your abdomen. You have nausea or vomiting. You have a fever. You have increasing pain in your abdomen that is not relieved with medicine. These symptoms may be an emergency. Get help right away. Call 911. Do not wait to see if the symptoms will go away. Do not drive yourself to the hospital. Summary After the procedure, it is common to have a small amount of blood in your stool. You may also have mild cramping and bloating of your abdomen. If you were given a sedative during the procedure, it can affect you for several hours. Do not drive or operate machinery until your health care provider says that it is safe. Get help right away if you have a lot of blood in your stool, nausea or vomiting, a fever, or increased pain in your abdomen. This information is not intended to replace advice given to you by your health care provider. Make sure you discuss any questions you have with your health care provider. Document  Revised: 02/02/2022 Document Reviewed: 08/13/2020 Elsevier Patient Education  2024 [employer]General Anesthesia, Adult, Care After The following information offers guidance on how to care for yourself after your procedure. Your health care provider may also give you more specific instructions. If you have problems or questions, contact your health care provider. What can I expect after the procedure? After the procedure, it is common for people to: Have pain or discomfort at the IV site. Have nausea or vomiting. Have a sore throat or hoarseness. Have  trouble concentrating. Feel cold or chills. Feel weak, sleepy, or tired (fatigue). Have soreness and body aches. These can affect parts of the body that were not involved in surgery. Follow these instructions at home: For the time period you were told by your health care provider:  Rest. Do not participate in activities where you could fall or become injured. Do not drive or use machinery. Do not drink alcohol. Do not take sleeping pills or medicines that cause drowsiness. Do not make important decisions or sign legal documents. Do not take care of children on your own. General instructions Drink enough fluid to keep your urine pale yellow. If you have sleep apnea, surgery and certain medicines can increase your risk for breathing problems. Follow instructions from your health care provider about wearing your sleep device: Anytime you are sleeping, including during daytime naps. While taking prescription pain medicines, sleeping medicines, or medicines that make you drowsy. Return to your normal activities as told by your health care provider. Ask your health care provider what activities are safe for you. Take over-the-counter and prescription medicines only as told by your health care provider. Do not use any products that contain nicotine  or tobacco. These products include cigarettes, chewing tobacco, and vaping devices, such as  e-cigarettes. These can delay incision healing after surgery. If you need help quitting, ask your health care provider. Contact a health care provider if: You have nausea or vomiting that does not get better with medicine. You vomit every time you eat or drink. You have pain that does not get better with medicine. You cannot urinate or have bloody urine. You develop a skin rash. You have a fever. Get help right away if: You have trouble breathing. You have chest pain. You vomit blood. These symptoms may be an emergency. Get help right away. Call 911. Do not wait to see if the symptoms will go away. Do not drive yourself to the hospital. Summary After the procedure, it is common to have a sore throat, hoarseness, nausea, vomiting, or to feel weak, sleepy, or fatigue. For the time period you were told by your health care provider, do not drive or use machinery. Get help right away if you have difficulty breathing, have chest pain, or vomit blood. These symptoms may be an emergency. This information is not intended to replace advice given to you by your health care provider. Make sure you discuss any questions you have with your health care provider. Document Revised: 03/20/2021 Document Reviewed: 03/20/2021 Elsevier Patient Education  2024 [employer].

## 2023-07-07 NOTE — Progress Notes (Signed)
   07/07/23 1431  OBSTRUCTIVE SLEEP APNEA  Have you ever been diagnosed with sleep apnea through a sleep study? No  Do you snore loudly (loud enough to be heard through closed doors)?  1  Do you often feel tired, fatigued, or sleepy during the daytime (such as falling asleep during driving or talking to someone)? 0  Has anyone observed you stop breathing during your sleep? 1  Do you have, or are you being treated for high blood pressure? 1  BMI more than 35 kg/m2? 0  Age > 50 (1-yes) 1  Neck circumference greater than:Female 16 inches or larger, Female 17inches or larger? 0  Female Gender (Yes=1) 0  Obstructive Sleep Apnea Score 4  Score 5 or greater  Results sent to PCP

## 2023-07-07 NOTE — Pre-Procedure Instructions (Signed)
 Attempted pre-op phonecall. Left VM for her to call us back.

## 2023-07-11 ENCOUNTER — Ambulatory Visit (HOSPITAL_COMMUNITY): Admitting: Certified Registered Nurse Anesthetist

## 2023-07-11 ENCOUNTER — Encounter (HOSPITAL_COMMUNITY)
Admission: RE | Disposition: A | Discharge status: Home / Self Care | Payer: Self-pay | Source: Home / Self Care | Attending: Internal Medicine

## 2023-07-11 ENCOUNTER — Encounter (HOSPITAL_COMMUNITY): Payer: Self-pay | Admitting: Internal Medicine

## 2023-07-11 ENCOUNTER — Ambulatory Visit (HOSPITAL_BASED_OUTPATIENT_CLINIC_OR_DEPARTMENT_OTHER): Admitting: Certified Registered Nurse Anesthetist

## 2023-07-11 ENCOUNTER — Ambulatory Visit (HOSPITAL_COMMUNITY)
Admission: RE | Admit: 2023-07-11 | Discharge: 2023-07-11 | Disposition: A | Discharge status: Home / Self Care | Attending: Internal Medicine | Admitting: Internal Medicine

## 2023-07-11 DIAGNOSIS — I509 Heart failure, unspecified: Secondary | ICD-10-CM | POA: Diagnosis not present

## 2023-07-11 DIAGNOSIS — D125 Benign neoplasm of sigmoid colon: Secondary | ICD-10-CM

## 2023-07-11 DIAGNOSIS — I110 Hypertensive heart disease with heart failure: Secondary | ICD-10-CM | POA: Insufficient documentation

## 2023-07-11 DIAGNOSIS — K633 Ulcer of intestine: Secondary | ICD-10-CM

## 2023-07-11 DIAGNOSIS — K625 Hemorrhage of anus and rectum: Secondary | ICD-10-CM | POA: Diagnosis not present

## 2023-07-11 DIAGNOSIS — Z7984 Long term (current) use of oral hypoglycemic drugs: Secondary | ICD-10-CM | POA: Diagnosis not present

## 2023-07-11 DIAGNOSIS — F32A Depression, unspecified: Secondary | ICD-10-CM | POA: Insufficient documentation

## 2023-07-11 DIAGNOSIS — J449 Chronic obstructive pulmonary disease, unspecified: Secondary | ICD-10-CM | POA: Diagnosis not present

## 2023-07-11 DIAGNOSIS — D123 Benign neoplasm of transverse colon: Secondary | ICD-10-CM | POA: Diagnosis not present

## 2023-07-11 DIAGNOSIS — K635 Polyp of colon: Secondary | ICD-10-CM | POA: Diagnosis not present

## 2023-07-11 DIAGNOSIS — K648 Other hemorrhoids: Secondary | ICD-10-CM | POA: Diagnosis not present

## 2023-07-11 DIAGNOSIS — F17210 Nicotine dependence, cigarettes, uncomplicated: Secondary | ICD-10-CM | POA: Diagnosis not present

## 2023-07-11 DIAGNOSIS — Z7951 Long term (current) use of inhaled steroids: Secondary | ICD-10-CM | POA: Diagnosis not present

## 2023-07-11 DIAGNOSIS — Z7901 Long term (current) use of anticoagulants: Secondary | ICD-10-CM | POA: Insufficient documentation

## 2023-07-11 DIAGNOSIS — Z79899 Other long term (current) drug therapy: Secondary | ICD-10-CM | POA: Diagnosis not present

## 2023-07-11 DIAGNOSIS — I10 Essential (primary) hypertension: Secondary | ICD-10-CM

## 2023-07-11 HISTORY — PX: COLONOSCOPY: SHX5424

## 2023-07-11 SURGERY — COLONOSCOPY
Anesthesia: General

## 2023-07-11 MED ORDER — PROPOFOL 500 MG/50ML IV EMUL
INTRAVENOUS | Status: DC | PRN
  Administered 2023-07-11: 100 mg via INTRAVENOUS
  Administered 2023-07-11: 150 ug/kg/min via INTRAVENOUS

## 2023-07-11 MED ORDER — PHENYLEPHRINE 80 MCG/ML (10ML) SYRINGE FOR IV PUSH (FOR BLOOD PRESSURE SUPPORT)
PREFILLED_SYRINGE | INTRAVENOUS | Status: DC | PRN
Start: 2023-07-11 — End: 2023-07-11
  Administered 2023-07-11 (×4): 80 ug via INTRAVENOUS

## 2023-07-11 MED ORDER — LACTATED RINGERS IV SOLN: INTRAVENOUS | Status: DC | PRN

## 2023-07-11 MED ORDER — STERILE WATER FOR IRRIGATION IR SOLN
Status: DC | PRN
  Administered 2023-07-11: 120 mL

## 2023-07-11 MED ORDER — LACTATED RINGERS IV SOLN: INTRAVENOUS | Status: DC

## 2023-07-11 NOTE — Transfer of Care (Signed)
 Immediate Anesthesia Transfer of Care Note  Patient: Alison Kennedy  Procedure(s) Performed: COLONOSCOPY  Patient Location: Short Stay  Anesthesia Type:General  Level of Consciousness: awake  Airway & Oxygen Therapy: Patient Spontanous Breathing  Post-op Assessment: Report given to RN and Post -op Vital signs reviewed and stable  Post vital signs: Reviewed and stable  Last Vitals:  Vitals Value Taken Time  BP 99/48 07/11/23 10:40  Temp 36.4 C 07/11/23 10:40  Pulse 61 07/11/23 10:40  Resp 19 07/11/23 10:40  SpO2 100 % 07/11/23 10:40    Last Pain:  Vitals:   07/11/23 1040  TempSrc: Oral  PainSc: 0-No pain         Complications: No notable events documented.

## 2023-07-11 NOTE — Anesthesia Preprocedure Evaluation (Signed)
 Anesthesia Evaluation  Patient identified by MRN, date of birth, ID band Patient awake    Reviewed: Allergy & Precautions, H&P , NPO status , Patient's Chart, lab work & pertinent test results, reviewed documented beta blocker date and time   Airway Mallampati: II  TM Distance: >3 FB Neck ROM: full    Dental no notable dental hx.    Pulmonary neg pulmonary ROS, COPD, Current Smoker   Pulmonary exam normal breath sounds clear to auscultation       Cardiovascular Exercise Tolerance: Good hypertension, +CHF  negative cardio ROS + Valvular Problems/Murmurs  Rhythm:regular Rate:Normal     Neuro/Psych  PSYCHIATRIC DISORDERS  Depression    negative neurological ROS  negative psych ROS   GI/Hepatic negative GI ROS, Neg liver ROS,,,  Endo/Other  negative endocrine ROS    Renal/GU negative Renal ROS  negative genitourinary   Musculoskeletal   Abdominal   Peds  Hematology negative hematology ROS (+)   Anesthesia Other Findings   Reproductive/Obstetrics negative OB ROS                              Anesthesia Physical Anesthesia Plan  ASA: 3  Anesthesia Plan: General   Post-op Pain Management:    Induction:   PONV Risk Score and Plan: Propofol  infusion  Airway Management Planned:   Additional Equipment:   Intra-op Plan:   Post-operative Plan:   Informed Consent: I have reviewed the patients History and Physical, chart, labs and discussed the procedure including the risks, benefits and alternatives for the proposed anesthesia with the patient or authorized representative who has indicated his/her understanding and acceptance.     Dental Advisory Given  Plan Discussed with: CRNA  Anesthesia Plan Comments:         Anesthesia Quick Evaluation

## 2023-07-11 NOTE — H&P (Signed)
 Primary Care Physician:  Terry Wilhelmena Lloyd Hilario, FNP Primary Gastroenterologist:  Dr. Cindie  Pre-Procedure History & Physical: HPI:  Alison Kennedy is a 58 y.o. 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 Authorizing Provider  bisoprolol  (ZEBETA ) 5 MG tablet Take 0.5 tablets (2.5 mg total) by mouth daily. 05/05/23  Yes Dunlap, Lorette GRADE, PA-C  Cholecalciferol (VITAMIN D -3) 125 MCG (5000 UT) TABS Take 5,000 Units by mouth daily.   Yes [provider]  fluticasone -salmeterol (ADVAIR) 250-50 MCG/ACT AEPB Inhale 1 puff into the lungs every 12 (twelve) hours. 03/25/23  Yes Darlean Ozell NOVAK, MD  furosemide  (LASIX ) 20 MG tablet Take 1 tablet (20 mg total) by mouth daily as needed. 06/09/23  Yes Dunlap, Lorette GRADE, PA-C  rosuvastatin  (CRESTOR ) 20 MG tablet Take 1 tablet (20 mg total) by mouth daily. 04/22/23  Yes Del Orbe Polanco, Hilario, FNP  traZODone  (DESYREL ) 50 MG tablet Take 1 tablet (50 mg total) by mouth at bedtime as needed. 04/21/23  Yes Del Orbe Polanco, Hilario, FNP  valsartan  (DIOVAN ) 40 MG tablet Take 1 tablet (40 mg total) by mouth daily. 03/14/23  Yes Darlean Ozell NOVAK, MD  albuterol  (VENTOLIN  HFA) 108 (90 Base) MCG/ACT inhaler Inhale 2 puffs into the lungs every 6 (six) hours as needed for wheezing or shortness of breath. 04/08/23 02/26/35  Darlean Ozell NOVAK, MD  apixaban  (ELIQUIS ) 5 MG TABS tablet Take 1 tablet (5 mg total) by mouth 2 (two) times daily. 05/05/23   Sheron Lorette GRADE, PA-C  empagliflozin  (JARDIANCE ) 10 MG TABS tablet  Take 1 tablet (10 mg total) by mouth daily before breakfast. 06/09/23   Sheron Lorette GRADE, PA-C    Allergies as of 06/13/2023   (No Known Allergies)    Family History  Problem Relation Age of Onset   COPD Father    Colon cancer Father        unsure age - passed in 29s   Asthma Brother    Hypertension Brother    Hyperlipidemia Brother    Heart disease Maternal Grandfather    COPD Maternal Grandfather    Heart murmur Daughter    Asthma Son    Colon cancer Maternal Aunt        s/p resection    Social History   Socioeconomic History   Marital status: Divorced    Spouse name: Not on file   Number of children: 4   Years of education: Not on file   Highest education level: 12th grade  Occupational History   Not on file  Tobacco Use   Smoking status: Every Day    Current packs/day: 1.00    Average packs/day: 1 pack/day for 30.0 years (30.0 ttl pk-yrs)    Types: Cigarettes   Smokeless tobacco: Never  Vaping Use   Vaping status: Never Used  Substance and Sexual Activity   Alcohol use: Not Currently    Comment: fomer heavy drinker  Drug use: No    Comment: per pt's father   Sexual activity: Not Currently  Other Topics Concern   Not on file  Social History Narrative   Not on file   Social Drivers of Health   Financial Resource Strain: Not on file  Food Insecurity: Not on file  Transportation Needs: Not on file  Physical Activity: Not on file  Stress: Not on file  Social Connections: Not on file  Intimate Partner Violence: Not At Risk (11/14/2022)   Received from [facility]   HITS    Over the last 12 months how often did your partner physically hurt you?: Never    Over the last 12 months how often did your partner insult you or talk down to you?: Never    Over the last 12 months how often did your partner threaten you with physical harm?: Never    Over the last 12 months how often did your partner scream or curse at you?: Never    Review of Systems: See  HPI, otherwise negative ROS  Physical Exam: Vital signs in last 24 hours: Temp:  [98.4 F (36.9 C)] 98.4 F (36.9 C) (07/07 1000) Resp:  [18] 18 (07/07 1000) BP: (134)/(56) 134/56 (07/07 1000) SpO2:  [97 %] 97 % (07/07 1000)   General:   Alert,  Well-developed, well-nourished, pleasant and cooperative in NAD Head:  Normocephalic and atraumatic. Eyes:  Sclera clear, no icterus.   Conjunctiva pink. Ears:  Normal auditory acuity. Nose:  No deformity, discharge,  or lesions. Msk:  Symmetrical without gross deformities. Normal posture. Extremities:  Without clubbing or edema. Neurologic:  Alert and  oriented x4;  grossly normal neurologically. Skin:  Intact without significant lesions or rashes. Psych:  Alert and cooperative. Normal mood and affect.  Impression/Plan: Alison Kennedy is here for a colonoscopy to be performed for rectal bleeding  The risks of the procedure including infection, bleed, or perforation as well as benefits, limitations, alternatives and imponderables have been reviewed with the patient. Questions have been answered. All parties agreeable.

## 2023-07-11 NOTE — Op Note (Signed)
 [facility] Patient Name: Alison Kennedy Procedure Date: 07/11/2023 9:54 AM MRN: [id number] Date of Birth: [birth year] Attending MD: Carlin POUR. Cindie , [place], [id number] CSN: [id number] Age: 58 Admit Type: Outpatient Procedure:                Colonoscopy Indications:              Rectal bleeding Providers:                Carlin POUR. Cindie, DO, Devere Lodge, Daphne Mulch                            Technician, Technician Referring MD:              Medicines:                See the Anesthesia note for documentation of the                            administered medications Complications:            No immediate complications. Estimated Blood Loss:     Estimated blood loss was minimal. Procedure:                Pre-Anesthesia Assessment:                           - The anesthesia plan was to use monitored                            anesthesia care (MAC).                           After obtaining informed consent, the colonoscope                            was passed under direct vision. Throughout the                            procedure, the patient's blood pressure, pulse, and                            oxygen saturations were monitored continuously. The                            [id number]-HQ190L ([id number]) scope was introduced through                            the anus and advanced to the the cecum, identified                            by appendiceal orifice and ileocecal valve. The                            colonoscopy was performed without difficulty. The                            patient tolerated the procedure well. The quality  of the bowel preparation was evaluated using the                            BBPS [facility] Bowel Preparation Scale) with scores                            of: Right Colon = 3, Transverse Colon = 3 and Left                            Colon = 3 (entire mucosa seen well with no residual                            staining, small fragments  of stool or opaque                            liquid). The total BBPS score equals 9. Scope In: 10:15:38 AM Scope Out: 10:35:19 AM Scope Withdrawal Time: 0 hours 15 minutes 15 seconds  Total Procedure Duration: 0 hours 19 minutes 41 seconds  Findings:      Non-bleeding internal hemorrhoids were found.      A 12 mm polyp was found in the sigmoid colon. The polyp was       pedunculated. The polyp was removed with a cold snare. Resection and       retrieval were complete.      A 4 mm polyp was found in the transverse colon. The polyp was sessile.       The polyp was removed with a cold snare. Resection and retrieval were       complete.      A single (solitary) eight mm ulcer was found at the ileocecal valve. No       bleeding was present. No stigmata of recent bleeding were seen. Biopsies       were taken with a cold forceps for histology. Impression:               - Non-bleeding internal hemorrhoids.                           - One 12 mm polyp in the sigmoid colon, removed                            with a cold snare. Resected and retrieved.                           - One 4 mm polyp in the transverse colon, removed                            with a cold snare. Resected and retrieved.                           - A single (solitary) ulcer at the ileocecal valve.                            Biopsied. Moderate Sedation:      Per Anesthesia Care Recommendation:           -  Patient has a contact number available for                            emergencies. The signs and symptoms of potential                            delayed complications were discussed with the                            patient. Return to normal activities tomorrow.                            Written discharge instructions were provided to the                            patient.                           - Resume previous diet.                           - Continue present medications.                           - Await  pathology results.                           - Repeat colonoscopy in 3 years for surveillance.                           - Return to GI clinic in 8 weeks.                           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, recommend                            she avoid NSAIDs Procedure Code(s):        --- Professional ---                           [phone], Colonoscopy, flexible; with removal of                            tumor(s), polyp(s), or other lesion(s) by snare                            technique                           45380, 59, Colonoscopy, flexible; with biopsy,                            single or multiple Diagnosis Code(s):        --- Professional ---                           D12.5, Benign  neoplasm of sigmoid colon                           D12.3, Benign neoplasm of transverse colon (hepatic                            flexure or splenic flexure)                           K63.3, Ulcer of intestine                           K64.8, Other hemorrhoids                           K62.5, Hemorrhage of anus and rectum CPT copyright 2022 American Medical Association. All rights reserved. The codes documented in this report are preliminary and upon coder review may  be revised to meet current compliance requirements. Carlin POUR. Cindie, DO Carlin POUR. Lyndon Chenoweth, DO 07/11/2023 10:40:55 AM This report has been signed electronically. Number of Addenda: 0

## 2023-07-11 NOTE — Discharge Instructions (Signed)
  Colonoscopy Discharge Instructions  Read the instructions outlined below and refer to this sheet in the next few weeks. These discharge instructions provide you with general information on caring for yourself after you leave the hospital. Your doctor may also give you specific instructions. While your treatment has been planned according to the most current medical practices available, unavoidable complications occasionally occur.   ACTIVITY You may resume your regular activity, but move at a slower pace for the next 24 hours.  Take frequent rest periods for the next 24 hours.  Walking will help get rid of the air and reduce the bloated feeling in your belly (abdomen).  No driving for 24 hours (because of the medicine (anesthesia) used during the test).   Do not sign any important legal documents or operate any machinery for 24 hours (because of the anesthesia used during the test).  NUTRITION Drink plenty of fluids.  You may resume your normal diet as instructed by your doctor.  Begin with a light meal and progress to your normal diet. Heavy or fried foods are harder to digest and may make you feel sick to your stomach (nauseated).  Avoid alcoholic beverages for 24 hours or as instructed.  MEDICATIONS You may resume your normal medications unless your doctor tells you otherwise.  WHAT YOU CAN EXPECT TODAY Some feelings of bloating in the abdomen.  Passage of more gas than usual.  Spotting of blood in your stool or on the toilet paper.  IF YOU HAD POLYPS REMOVED DURING THE COLONOSCOPY: No aspirin products for 7 days or as instructed.  No alcohol for 7 days or as instructed.  Eat a soft diet for the next 24 hours.  FINDING OUT THE RESULTS OF YOUR TEST Not all test results are available during your visit. If your test results are not back during the visit, make an appointment with your caregiver to find out the results. Do not assume everything is normal if you have not heard from your  caregiver or the medical facility. It is important for you to follow up on all of your test results.  SEEK IMMEDIATE MEDICAL ATTENTION IF: You have more than a spotting of blood in your stool.  Your belly is swollen (abdominal distention).  You are nauseated or vomiting.  You have a temperature over 101.  You have abdominal pain or discomfort that is severe or gets worse throughout the day.   Your colonoscopy revealed 2 polyp(s) which I removed successfully.  One of the polyps was on the larger side.  Await pathology results, my office will contact you. I recommend repeating colonoscopy in 3 years for surveillance purposes.   You also had an ulcer on the right side of your colon on the ileocecal valve.  These are usually caused from NSAIDs.  I took samples of this area.  We will call with these results.  Recommend you avoid NSAIDs.  Follow-up in GI office in 8 weeks.  I hope you have a great rest of your week!  Carlin POUR. Cindie, D.O. Gastroenterology and Hepatology [facility]

## 2023-07-11 NOTE — Anesthesia Postprocedure Evaluation (Signed)
 Anesthesia Post Note  Patient: Alison Kennedy  Procedure(s) Performed: COLONOSCOPY  Patient location during evaluation: Phase II Anesthesia Type: General Level of consciousness: awake Pain management: pain level controlled Vital Signs Assessment: post-procedure vital signs reviewed and stable Respiratory status: spontaneous breathing and respiratory function stable Cardiovascular status: blood pressure returned to baseline and stable Postop Assessment: no headache and no apparent nausea or vomiting Anesthetic complications: no Comments: Late entry   No notable events documented.   Last Vitals:  Vitals:   07/11/23 1000 07/11/23 1040  BP: (!) 134/56 (!) 99/48  Pulse:  61  Resp: 18 19  Temp: 36.9 C 36.4 C  SpO2: 97% 100%    Last Pain:  Vitals:   07/11/23 1040  TempSrc: Oral  PainSc: 0-No pain                 Yvonna JINNY Bosworth

## 2023-07-12 ENCOUNTER — Encounter (HOSPITAL_COMMUNITY): Payer: Self-pay | Admitting: Internal Medicine

## 2023-07-12 LAB — SURGICAL PATHOLOGY

## 2023-07-15 DIAGNOSIS — I493 Ventricular premature depolarization: Secondary | ICD-10-CM | POA: Diagnosis not present

## 2023-07-19 ENCOUNTER — Other Ambulatory Visit (HOSPITAL_COMMUNITY): Payer: Self-pay

## 2023-07-19 ENCOUNTER — Telehealth: Payer: Self-pay | Admitting: Pharmacy Technician

## 2023-07-19 ENCOUNTER — Ambulatory Visit: Payer: Self-pay | Admitting: Internal Medicine

## 2023-07-19 MED ORDER — BISOPROLOL FUMARATE 5 MG PO TABS: 5.0000 mg | ORAL_TABLET | Freq: Every day | ORAL | 2 refills | Status: DC

## 2023-07-19 MED ORDER — VALSARTAN 40 MG PO TABS: 20.0000 mg | ORAL_TABLET | Freq: Every day | ORAL | 2 refills | Status: AC

## 2023-07-19 NOTE — Telephone Encounter (Signed)
 Bisoprolol  2.5mg , 5mg , and 10mg  all say: product not covered- plan/benefit exclusion   I spoke to [employer] and they said she paid cash last time. They ran again and 24.00

## 2023-07-19 NOTE — Telephone Encounter (Signed)
Pt returning call to nurse for results

## 2023-07-19 NOTE — Telephone Encounter (Signed)
 Alison CINDERELLA Kapur, PA-C 07/19/2023  9:33 AM EDT     Attempted to call patient but no answer and left voicemail to call back. Please follow up and monitor patient:  Overall heart monitor showed normal sinus rhythm with average HR of 65. There were a few short runs of fast heartbeats from both the upper (SVT/supraventricular tachycardia) and lower chambers (NSVT/nonsustained ventricular tachycardia) of your heart. These fast rhythms were brief and did not last long enough to be dangerous. However, extra beats from the lower chambers (PVCs) were present more frequently at 16.1%, which can cause fluttering or skipped beat sensations.    Recommend referral to EP for further evaluation to be considered for anti-arrhythmic medication.  In the mean time, discuss medication adjustment such that Valsartan  is decreased to 20 mg and Bisoprolol  is increased to 5 mg. Last time patient was in office BP was 90/78. Strongly encourage to monitor BP and submit 2 week BP log.    Also see if patient is available to be reschedule to October 8 or 9th  for an afternoon appointment between 1-4 pm so I can follow up with patient.       Results discussed with patient and new medication doses sent to [facility]   She will keep daily bp log for 3 weeks and drop off at office.   Referral placed to EP   Message to front desk to schedule October appointment.

## 2023-07-26 ENCOUNTER — Ambulatory Visit (HOSPITAL_COMMUNITY)
Admission: RE | Admit: 2023-07-26 | Discharge: 2023-07-26 | Disposition: A | Discharge status: Home / Self Care | Source: Ambulatory Visit | Attending: Internal Medicine | Admitting: Internal Medicine

## 2023-07-26 DIAGNOSIS — J449 Chronic obstructive pulmonary disease, unspecified: Secondary | ICD-10-CM | POA: Diagnosis present

## 2023-07-26 LAB — PULMONARY FUNCTION TEST
DL/VA % pred: 77 %
DL/VA: 3.39 ml/min/mmHg/L
DLCO unc % pred: 42 %
DLCO unc: 7.39 ml/min/mmHg
FEF 25-75 Post: 0.74 L/s
FEF 25-75 Pre: 0.34 L/s
FEF2575-%Change-Post: 120 %
FEF2575-%Pred-Post: 34 %
FEF2575-%Pred-Pre: 15 %
FEV1-%Change-Post: 29 %
FEV1-%Pred-Post: 39 %
FEV1-%Pred-Pre: 30 %
FEV1-Post: 0.88 L
FEV1-Pre: 0.68 L
FEV1FVC-%Change-Post: 8 %
FEV1FVC-%Pred-Pre: 77 %
FEV6-%Change-Post: 19 %
FEV6-%Pred-Post: 48 %
FEV6-%Pred-Pre: 40 %
FEV6-Post: 1.34 L
FEV6-Pre: 1.12 L
FEV6FVC-%Pred-Post: 103 %
FEV6FVC-%Pred-Pre: 103 %
FVC-%Change-Post: 19 %
FVC-%Pred-Post: 46 %
FVC-%Pred-Pre: 39 %
FVC-Post: 1.34 L
FVC-Pre: 1.12 L
Post FEV1/FVC ratio: 65 %
Post FEV6/FVC ratio: 100 %
Pre FEV1/FVC ratio: 61 %
Pre FEV6/FVC Ratio: 100 %
RV % pred: 256 %
RV: 4.53 L
TLC % pred: 127 %
TLC: 5.69 L

## 2023-07-26 MED ORDER — ALBUTEROL SULFATE (2.5 MG/3ML) 0.083% IN NEBU
2.5000 mg | INHALATION_SOLUTION | Freq: Once | RESPIRATORY_TRACT | Status: AC
  Administered 2023-07-26: 2.5 mg via RESPIRATORY_TRACT

## 2023-07-31 ENCOUNTER — Ambulatory Visit: Payer: Self-pay | Admitting: Internal Medicine

## 2023-08-01 ENCOUNTER — Encounter: Payer: Self-pay | Admitting: *Deleted

## 2023-08-02 ENCOUNTER — Other Ambulatory Visit: Payer: Self-pay

## 2023-08-02 ENCOUNTER — Other Ambulatory Visit (HOSPITAL_COMMUNITY): Payer: Self-pay

## 2023-08-02 ENCOUNTER — Telehealth: Payer: Self-pay

## 2023-08-02 DIAGNOSIS — J449 Chronic obstructive pulmonary disease, unspecified: Secondary | ICD-10-CM

## 2023-08-02 MED ORDER — INCRUSE ELLIPTA 62.5 MCG/ACT IN AEPB: 1.0000 | INHALATION_SPRAY | Freq: Every day | RESPIRATORY_TRACT | 11 refills | Status: AC

## 2023-08-02 MED ORDER — UMECLIDINIUM BROMIDE 62.5 MCG/ACT IN AEPB: 1.0000 | INHALATION_SPRAY | Freq: Every day | RESPIRATORY_TRACT | Status: DC

## 2023-08-02 NOTE — Telephone Encounter (Signed)
*  Pulm  Pharmacy Patient Advocate Encounter   Received notification from Physician's Office that prior authorization for Incruse Ellipta  is required/requested.   Insurance verification completed.   The patient is insured through [facility] .   Per test claim: The current 30 day co-pay is, $4.00.  No PA needed at this time. This test claim was processed through [facility]- copay amounts may vary at other pharmacies due to pharmacy/plan contracts, or as the patient moves through the different stages of their insurance plan.

## 2023-08-02 NOTE — Telephone Encounter (Signed)
 ATC X1 regarding results lmtcb, sent rx to pharm

## 2023-08-02 NOTE — Telephone Encounter (Signed)
 Is incruse covered by pt insurance?

## 2023-08-02 NOTE — Progress Notes (Unsigned)
 Alison Kennedy, female    DOB: Jun 29, 1965    MRN: [id number]   Brief patient profile:  27  yowf  active smoker  referred to pulmonary clinic in [place]  12/23/2022 by [place] Gibble NP  in [place]  for COPD f/u  arrived back in [place] Nov 2024   Spirometry 03/19/2011    - Fev1 1.11L/46%, FVC 1.67L/58%, Ratio 0. 67   History of Present Illness  12/23/2022  Pulmonary/ 1st office eval/ Cindie Rajagopalan / [place] Office  Chief Complaint  Patient presents with   Establish Care   Shortness of Breath  Dyspnea:  food lion ok / no hc  Cough:  minimal dry  Sleep: bed is flat one pillow and awakens 3/7 nights due to cough  SABA use: avg 2 in 24 h  02: none  LDSCT:  CTa  11/24/22 neg nodules  Rec Plan A = Automatic = Always=    Anoro or Trelegy 200 one click  each am :  really tug on it to get all the medication out then rinse and gargle. Work on inhaler technique: Plan B = Backup (to supplement plan A, not to replace it) Only use your albuterol  inhaler as a rescue medication I will see what I can do to expedite cardiology follow up so we can get you on the right meds.  If condition worsens go to [facility] hosp   Please schedule a follow up office visit in 12 weeks, call sooner if needed with all medications /inhalers/ solutions in hand    03/14/2023  f/u ov/[place] office/Claiborne Stroble re: GOLD 3  maint on trelegy 200  did not  bring meds / on ACEi  Chief Complaint  Patient presents with   Follow-up    Follow up , having some light headiness   Dyspnea:  can do food lion slow pace  Cough: worse p supper non productive  Sleeping: flat bed one pillow  wakes once q night  with cough close to bedtime  SABA use: once a day s pattern  02: none  Rec Stop lisinopril and replace with valsartan  40 mg one daily  The key is to stop smoking completely before smoking completely stops you! Change to Trelegy 100 one click each am   CTa 05/23/23 1. No evidence of pulmonary embolism to the segmental  level. 2. Dilated main pulmonary arteries, right atrium, and reflux of contrast agent into the hepatic veins suggestive of pulmonary arterial hypertension. 3. Postsu rgical changes from prior median sternotomy.  06/20/2023  f/u ov/[place] office/Magaby Rumberger re: COPD GOLD 3   maint on advair 250  / still smoking  Chief Complaint  Patient presents with   Follow-up    Shob - doe   COPD  Dyspnea:  food lion slow pace one aisle  Cough: after supper but not hs - worse with trelegy/ prefers advair   Sleeping: flat bed inhaler    resp cc  SABA use: late pm seems to help cough  02: none  Rec   08/04/2023  f/u ov/[place] office/Yumi Insalaco re: *** maint on ***  No chief complaint on file.   Dyspnea:  *** Cough: *** Sleeping: ***   resp cc  SABA use: *** 02: ***  Lung cancer screening: ***   No obvious day to day or daytime variability or assoc excess/ purulent sputum or mucus plugs or hemoptysis or cp or chest tightness, subjective wheeze or overt sinus or hb symptoms.    Also denies any obvious fluctuation of  symptoms with weather or environmental changes or other aggravating or alleviating factors except as outlined above   No unusual exposure hx or h/o childhood pna/ asthma or knowledge of premature birth.  Current Allergies, Complete Past Medical History, Past Surgical History, Family History, and Social History were reviewed in [employer] record.  ROS  The following are not active complaints unless bolded Hoarseness, sore throat, dysphagia, dental problems, itching, sneezing,  nasal congestion or discharge of excess mucus or purulent secretions, ear ache,   fever, chills, sweats, unintended wt loss or wt gain, classically pleuritic or exertional cp,  orthopnea pnd or arm/hand swelling  or leg swelling, presyncope, palpitations, abdominal pain, anorexia, nausea, vomiting, diarrhea  or change in bowel habits or change in bladder habits, change in stools or change in  urine, dysuria, hematuria,  rash, arthralgias, visual complaints, headache, numbness, weakness or ataxia or problems with walking or coordination,  change in mood or  memory.        No outpatient medications have been marked as taking for the 08/04/23 encounter (Appointment) with Jamaris Theard B, MD.                  Past Medical History:  Diagnosis Date   Alcohol abuse    Depression    Heart murmur       Objective:    Wts  08/04/2023       ***  06/20/2023       143   03/14/23 133 lb (60.3 kg)  12/23/22 134 lb (60.8 kg)  07/03/17 138 lb (62.6 kg)    Vital signs reviewed  08/04/2023  - Note at rest 02 sats  ***% on ***   General appearance:    ***   dentition poor  Mild bar***            Assessment

## 2023-08-03 ENCOUNTER — Other Ambulatory Visit: Payer: Self-pay | Admitting: Internal Medicine

## 2023-08-04 ENCOUNTER — Encounter: Payer: Self-pay | Admitting: Internal Medicine

## 2023-08-04 ENCOUNTER — Encounter: Payer: Self-pay | Admitting: Physician Assistant

## 2023-08-04 ENCOUNTER — Ambulatory Visit (INDEPENDENT_AMBULATORY_CARE_PROVIDER_SITE_OTHER): Admitting: Internal Medicine

## 2023-08-04 VITALS — BP 121/66 | HR 61 | Ht 60.0 in | Wt 145.6 lb

## 2023-08-04 DIAGNOSIS — F17210 Nicotine dependence, cigarettes, uncomplicated: Secondary | ICD-10-CM | POA: Diagnosis not present

## 2023-08-04 DIAGNOSIS — J449 Chronic obstructive pulmonary disease, unspecified: Secondary | ICD-10-CM | POA: Diagnosis not present

## 2023-08-04 MED ORDER — VENTOLIN HFA 108 (90 BASE) MCG/ACT IN AERS: 1.0000 | INHALATION_SPRAY | RESPIRATORY_TRACT | 11 refills | Status: AC | PRN

## 2023-08-04 NOTE — Patient Instructions (Addendum)
 Plan A = Automatic = Always=    Advair 250 followed by Incruse one puff each then only the Advair 250 one click 12 hours later  Plan B = Backup (to supplement plan A, not to replace it) Use your albuterol  inhaler as a rescue medication to be used if you can't catch your breath by resting or slowing your pace  or doing a relaxed purse lip breathing pattern.  - The less you use it, the better it will work when you need it. - Ok to use the inhaler up to 2 puffs  every 4 hours if you must but call for appointment if use goes up over your usual need - Don't leave home without it !!  (think of it like the spare tire or starter fluid for your car)     Also  Ok to try albuterol  15 min before an activity (on alternating days)  that you know would usually make you short of breath and see if it makes any difference and if makes none then don't take albuterol  after activity unless you can't catch your breath as this means it's the resting that helps, not the albuterol .       Please schedule a follow up visit in 3 months but call sooner if needed - bring inhalers

## 2023-08-04 NOTE — Assessment & Plan Note (Addendum)
 Counseled re importance of smoking cessation but did not meet time criteria for separate billing    - LCS not needed until 05/2024  as had CTa 05/2023 no nodules/ reviewed with pt     F/u q 3 m sooner prn    Each maintenance medication was reviewed in detail including emphasizing most importantly the difference between maintenance and prns and under what circumstances the prns are to be triggered using an action plan format where appropriate.  Total time for H and P, chart review, counseling, reviewing hfa/dpi device(s) and generating customized AVS unique to this office visit / same day charting = 30 min

## 2023-08-04 NOTE — Assessment & Plan Note (Signed)
 Active smoker - Spirometry 03/19/2011  fev1 1.11L/46%, FVC 1.67L/58%, Ratio 67 - 12/23/2022  After extensive coaching inhaler device,  effectiveness =    hfa 25% and DPI 50% so try trelegy 200 samples x 12 weeks while awaiting medicaid approval.  - 03/14/2023 change to trelegy 100 each am and try off acei > made her cough  - 06/20/2023  After extensive coaching inhaler device,  effectiveness =    50% with hfa so return in 6 weeks with pfts and consider change to sym 160/spiriva as insurance won't cover bretri  - 06/20/2023   Walked on RA  x  3  lap(s) =  approx 450  ft  @ mod pace, stopped due to end of stud  with lowest 02 sats 94% and mild sob last lap   - PFT's  07/26/23  FEV1 0.88 (39 % ) ratio 0.65  p 29 % improvement from saba p advair prior to study with DLCO  7.40 (42%)   and FV curve mildly concave > added incruse     - Allergy screen 08/04/2023 >  Eos 0.  And alpha one AT phenotype    Group D (now reclassified as E) in terms of symptom/risk and laba/lama/ICS  therefore appropriate rx at this point >>>  advair/ incruse and approp saba  Re SABA :  I spent extra time with pt today reviewing appropriate use of albuterol  for prn use on exertion with the following points: 1) saba is for relief of sob that does not improve by walking a slower pace or resting but rather if the pt does not improve after trying this first. 2) If the pt is convinced, as many are, that saba helps recover from activity faster then it's easy to tell if this is the case by re-challenging : ie stop, take the inhaler, then p 5 minutes try the exact same activity (intensity of workload) that just caused the symptoms and see if they are substantially diminished or not after saba 3) if there is an activity that reproducibly causes the symptoms, try the saba 15 min before the activity on alternate days   If in fact the saba really does help, then fine to continue to use it prn but advised may need to look closer at the maintenance  regimen being used to achieve better control of airways disease with exertion.

## 2023-08-05 NOTE — Progress Notes (Signed)
 Pt seen in office 08-04-23

## 2023-08-07 LAB — CBC WITH DIFFERENTIAL/PLATELET
Basophils Absolute: 0 x10E3/uL (ref 0.0–0.2)
Basos: 1 %
EOS (ABSOLUTE): 0.1 x10E3/uL (ref 0.0–0.4)
Eos: 1 %
Hematocrit: 41.1 % (ref 34.0–46.6)
Hemoglobin: 13.6 g/dL (ref 11.1–15.9)
Immature Grans (Abs): 0 x10E3/uL (ref 0.0–0.1)
Immature Granulocytes: 0 %
Lymphocytes Absolute: 3.3 x10E3/uL — ABNORMAL HIGH (ref 0.7–3.1)
Lymphs: 39 %
MCH: 32.4 pg (ref 26.6–33.0)
MCHC: 33.1 g/dL (ref 31.5–35.7)
MCV: 98 fL — ABNORMAL HIGH (ref 79–97)
Monocytes Absolute: 0.5 x10E3/uL (ref 0.1–0.9)
Monocytes: 6 %
Neutrophils Absolute: 4.5 x10E3/uL (ref 1.4–7.0)
Neutrophils: 53 %
Platelets: 197 x10E3/uL (ref 150–450)
RBC: 4.2 x10E6/uL (ref 3.77–5.28)
RDW: 12 % (ref 11.7–15.4)
WBC: 8.5 x10E3/uL (ref 3.4–10.8)

## 2023-08-07 LAB — BASIC METABOLIC PANEL WITH GFR
BUN/Creatinine Ratio: 20 (ref 9–23)
BUN: 17 mg/dL (ref 6–24)
CO2: 21 mmol/L (ref 20–29)
Calcium: 9.6 mg/dL (ref 8.7–10.2)
Chloride: 101 mmol/L (ref 96–106)
Creatinine, Ser: 0.83 mg/dL (ref 0.57–1.00)
Glucose: 77 mg/dL (ref 70–99)
Potassium: 4.8 mmol/L (ref 3.5–5.2)
Sodium: 137 mmol/L (ref 134–144)
eGFR: 81 mL/min/1.73 (ref >59)

## 2023-08-07 LAB — ALPHA-1-ANTITRYPSIN PHENOTYP: A-1 Antitrypsin: 151 mg/dL (ref 101–187)

## 2023-08-08 ENCOUNTER — Ambulatory Visit: Payer: Self-pay | Admitting: Internal Medicine

## 2023-08-08 ENCOUNTER — Ambulatory Visit: Payer: Self-pay | Admitting: Physician Assistant

## 2023-08-08 NOTE — Progress Notes (Signed)
 ATC x1 lmtcb

## 2023-08-09 ENCOUNTER — Other Ambulatory Visit (HOSPITAL_COMMUNITY): Payer: Self-pay

## 2023-08-09 ENCOUNTER — Telehealth: Payer: Self-pay

## 2023-08-09 NOTE — Telephone Encounter (Signed)
*  Pulm  Pharmacy Patient Advocate Encounter   Received notification from CoverMyMeds that prior authorization for Fluticasone -Salmeterol 250-50MCG/ACT aerosol powder  is required/requested.   Insurance verification completed.   The patient is insured through [facility] .   Per test claim:  Brand Advair Diskus is preferred by the insurance.  If suggested medication is appropriate, Please send in a new RX and discontinue this one. If not, please advise as to why it's not appropriate so that we may request a Prior Authorization. Please note, some preferred medications may still require a PA.  If the suggested medications have not been trialed and there are no contraindications to their use, the PA will not be submitted, as it will not be approved.   Brand Advair Diskus- $4.00

## 2023-08-09 NOTE — Telephone Encounter (Signed)
 Patient returned RN's call.

## 2023-08-09 NOTE — Progress Notes (Signed)
 ATCx2 lmtcb - printed letter

## 2023-08-09 NOTE — Telephone Encounter (Signed)
 Dr Darlean Advair Diskus is preferred by the insurance is that okay?

## 2023-08-11 ENCOUNTER — Other Ambulatory Visit: Payer: Self-pay

## 2023-08-11 MED ORDER — FLUTICASONE-SALMETEROL 250-50 MCG/ACT IN AEPB: 1.0000 | INHALATION_SPRAY | Freq: Two times a day (BID) | RESPIRATORY_TRACT | 11 refills | Status: AC

## 2023-08-12 ENCOUNTER — Ambulatory Visit: Attending: Internal Medicine | Admitting: Internal Medicine

## 2023-08-12 ENCOUNTER — Encounter: Payer: Self-pay | Admitting: Internal Medicine

## 2023-08-12 VITALS — BP 122/68 | HR 57 | Ht 64.0 in | Wt 146.5 lb

## 2023-08-12 DIAGNOSIS — I493 Ventricular premature depolarization: Secondary | ICD-10-CM | POA: Diagnosis not present

## 2023-08-12 MED ORDER — BISOPROLOL FUMARATE 5 MG PO TABS: 5.0000 mg | ORAL_TABLET | Freq: Every day | ORAL | Status: DC

## 2023-08-12 NOTE — Progress Notes (Signed)
 HPI Alison Kennedy is referred for evaluation of palpitations and PVC's. She is a pleasant 58 yo woman with a h/o septal defect s/p repair. Not sure if VSD or ASD. Repair done in [place] when she was a child. The patient has COPD and is still smoking a PPD. She has not required oxygen and follows with Dr. Darlean. She has palpitations mostly at night. She wore a cardia monitor which showed NS SVT and 16% PVC's. She has not had syncope and is mostly asymptomatic except at night time. By echo she has mild LV dysfunction.  No Known Allergies   Current Outpatient Medications  Medication Sig Dispense Refill   apixaban  (ELIQUIS ) 5 MG TABS tablet Take 1 tablet (5 mg total) by mouth 2 (two) times daily. 180 tablet 1   Cholecalciferol (VITAMIN D -3) 125 MCG (5000 UT) TABS Take 5,000 Units by mouth daily.     empagliflozin  (JARDIANCE ) 10 MG TABS tablet Take 1 tablet (10 mg total) by mouth daily before breakfast. 30 tablet 11   fluticasone -salmeterol (ADVAIR) 250-50 MCG/ACT AEPB Inhale 1 puff into the lungs every 12 (twelve) hours. 60 each 11   furosemide  (LASIX ) 20 MG tablet Take 1 tablet (20 mg total) by mouth daily as needed. 90 tablet 3   rosuvastatin  (CRESTOR ) 20 MG tablet Take 1 tablet (20 mg total) by mouth daily. 90 tablet 3   traZODone  (DESYREL ) 50 MG tablet Take 1 tablet (50 mg total) by mouth at bedtime as needed. 30 tablet 3   umeclidinium bromide  (INCRUSE ELLIPTA ) 62.5 MCG/ACT AEPB Inhale 1 puff into the lungs daily. 7 each 11   valsartan  (DIOVAN ) 40 MG tablet Take 0.5 tablets (20 mg total) by mouth daily. 45 tablet 2   VENTOLIN  HFA 108 (90 Base) MCG/ACT inhaler Inhale 1-2 puffs into the lungs every 4 (four) hours as needed for wheezing or shortness of breath. 18 g 11   bisoprolol  (ZEBETA ) 5 MG tablet Take 1 tablet (5 mg total) by mouth daily. May take 1 additional tablet as needed for palpations     No current facility-administered medications for this visit.     Past Medical History:   Diagnosis Date   Alcohol abuse    COPD (chronic obstructive pulmonary disease) (HCC)    Depression    Heart failure (HCC)    Heart murmur    Hyperlipidemia     ROS:   All systems reviewed and negative except as noted in the HPI.   Past Surgical History:  Procedure Laterality Date   COLONOSCOPY N/A 07/11/2023   Procedure: COLONOSCOPY;  Surgeon: Cindie Carlin POUR, DO;  Location: [facility];  Service: Endoscopy;  Laterality: N/A;  11:45 am, asa 3   DILATION AND CURETTAGE OF UTERUS     x2   EXPLORATION POST OPERATIVE OPEN HEART     hole in heart repaired     Family History  Problem Relation Age of Onset   COPD Father    Colon cancer Father        unsure age - passed in 47s   Asthma Brother    Hypertension Brother    Hyperlipidemia Brother    Heart disease Maternal Grandfather    COPD Maternal Grandfather    Heart murmur Daughter    Asthma Son    Colon cancer Maternal Aunt        s/p resection     Social History   Socioeconomic History   Marital status: Divorced  Spouse name: Not on file   Number of children: 4   Years of education: Not on file   Highest education level: 12th grade  Occupational History   Not on file  Tobacco Use   Smoking status: Every Day    Current packs/day: 1.00    Average packs/day: 1 pack/day for 30.0 years (30.0 ttl pk-yrs)    Types: Cigarettes   Smokeless tobacco: Never  Vaping Use   Vaping status: Never Used  Substance and Sexual Activity   Alcohol use: Not Currently    Comment: fomer heavy drinker   Drug use: No    Comment: per pt's father   Sexual activity: Not Currently  Other Topics Concern   Not on file  Social History Narrative   Not on file   Social Drivers of Health   Financial Resource Strain: Not on file  Food Insecurity: Not on file  Transportation Needs: Not on file  Physical Activity: Not on file  Stress: Not on file  Social Connections: Not on file  Intimate Partner Violence: Not At Risk  (11/14/2022)   Received from [facility]   HITS    Over the last 12 months how often did your partner physically hurt you?: Never    Over the last 12 months how often did your partner insult you or talk down to you?: Never    Over the last 12 months how often did your partner threaten you with physical harm?: Never    Over the last 12 months how often did your partner scream or curse at you?: Never     BP 122/68   Pulse (!) 57   Ht 5' 4 (1.626 m)   Wt 146 lb 8 oz (66.5 kg)   SpO2 96%   BMI 25.15 kg/m   Physical Exam:  Well appearing NAD HEENT: Unremarkable Neck:  No JVD, no thyromegally Lymphatics:  No adenopathy Back:  No CVA tenderness Lungs:  Clear HEART:  Regular rate rhythm, no murmurs, no rubs, no clicks Abd:  soft, positive bowel sounds, no organomegally, no rebound, no guarding Ext:  2 plus pulses, no edema, no cyanosis, no clubbing Skin:  No rashes no nodules Neuro:  CN II through XII intact, motor grossly intact  Assess/Plan: PVC's - we discussed the treatment options. Ideally we would use flecainide but her LV dysfunction makes that medication relatively contra-indicated. I have recommended taking an additional bisoprolol  as needed. We also discussed Mexitil but nausea is a common problem. We will hold off on this. She will call us  if her symptoms worsen. Tobacco abuse - I encouraged her to stop smoking.  LV dysfunction - she will continue GDMT.   Danelle Elizabeth Paulsen,MD

## 2023-08-12 NOTE — Patient Instructions (Addendum)
 Medication Instructions:  Your physician has recommended you make the following change in your medication:  You make take 1 extra bisoprolol  for palpations.  Lab Work: None ordered.  You may go to any [facility] Location for your lab work:  [facility] - [address] ([facility]) - [address] [phone] [address]  [place] - [address]   [place]  - [address]   [place] - [address] - [address] [employer]  - [address] ([facility]   If you have labs (blood work) drawn today and your tests are completely normal, you will receive your results only by: [employer] (if you have MyChart)  If you have any lab test that is abnormal or we need to change your treatment, we will call you or send a MyChart message to review the results.  Testing/Procedures: None ordered.  Follow-Up: At [facility], you and your health needs are our priority.  As part of our continuing mission to provide you with exceptional heart care, we have created designated Provider Care Teams.  These Care Teams include your primary Cardiologist (physician) and Advanced Practice Providers (APPs -  Physician Assistants and Nurse Practitioners) who all work together to provide you with the care you need, when you need it.  Your next appointment:   As needed  The format for your next appointment:   In Person  Provider:   Donnice Primus, MD or one of the following Advanced Practice Providers on your designated Care Team:   Alison Kennedy, [place] Alison Kennedy, [place] Alison Barrack, NP  Note: Remote monitoring is used to monitor your Pacemaker/ ICD from home. This monitoring reduces the number of office visits required to check your device to one time per year. It allows us  to keep an eye on the functioning of your device to ensure it is working properly.

## 2023-08-17 ENCOUNTER — Other Ambulatory Visit: Payer: Self-pay | Admitting: Family Medicine

## 2023-08-17 DIAGNOSIS — G479 Sleep disorder, unspecified: Secondary | ICD-10-CM

## 2023-08-23 ENCOUNTER — Other Ambulatory Visit: Payer: Self-pay | Admitting: Internal Medicine

## 2023-09-07 ENCOUNTER — Encounter: Payer: Self-pay | Admitting: Internal Medicine

## 2023-09-07 ENCOUNTER — Ambulatory Visit (INDEPENDENT_AMBULATORY_CARE_PROVIDER_SITE_OTHER): Payer: Self-pay | Admitting: Internal Medicine

## 2023-09-07 VITALS — BP 119/84 | HR 119 | Ht 64.0 in | Wt 146.8 lb

## 2023-09-07 DIAGNOSIS — M5441 Lumbago with sciatica, right side: Secondary | ICD-10-CM | POA: Diagnosis not present

## 2023-09-07 DIAGNOSIS — G8929 Other chronic pain: Secondary | ICD-10-CM | POA: Diagnosis not present

## 2023-09-07 MED ORDER — CYCLOBENZAPRINE HCL 5 MG PO TABS
5.0000 mg | ORAL_TABLET | Freq: Every day | ORAL | 1 refills | Status: AC
Start: 2023-09-07 — End: ?

## 2023-09-07 MED ORDER — PREDNISONE 10 MG (21) PO TBPK: ORAL_TABLET | ORAL | 0 refills | Status: AC

## 2023-09-07 NOTE — Progress Notes (Signed)
 Acute Office Visit  Subjective:    Patient ID: Alison Kennedy, female    DOB: 11/06/1965, 58 y.o.   MRN: [id number]  Chief Complaint  Patient presents with   Back Pain    Has been trying tylenol  for back pain, has not been helpful.     HPI Patient is in today for complaint of recent worsening of chronic low back pain for the last 2 weeks.  Her pain is constant, sharp, radiating to RLE and worse with bending.  She has noticed mild improvement with rest.  She has tried taking Tylenol  without much relief.  Of note, she takes Eliquis  for history of PE, unable to take NSAIDs.  Denies any recent injury or fall.  She has intermittent tingling of the left foot.  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    Family History  Problem Relation Age of Onset   COPD Father    Colon cancer Father        unsure age - passed in 69s   Asthma Brother    Hypertension Brother    Hyperlipidemia Brother    Heart disease Maternal Grandfather    COPD Maternal Grandfather    Heart murmur Daughter    Asthma Son    Colon cancer Maternal Aunt        s/p resection    Social History   Socioeconomic History   Marital status: Divorced    Spouse name: Not on file   Number of children: 4   Years of education: Not on file   Highest education level: 12th grade  Occupational History   Not on file  Tobacco Use   Smoking status: Every Day    Current packs/day: 1.00    Average packs/day: 1 pack/day for 30.0 years (30.0 ttl pk-yrs)     Types: Cigarettes   Smokeless tobacco: Never  Vaping Use   Vaping status: Never Used  Substance and Sexual Activity   Alcohol use: Not Currently    Comment: fomer heavy drinker   Drug use: No    Comment: per pt's father   Sexual activity: Not Currently  Other Topics Concern   Not on file  Social History Narrative   Not on file   Social Drivers of Health   Financial Resource Strain: Not on file  Food Insecurity: Not on file  Transportation Needs: Not on file  Physical Activity: Not on file  Stress: Not on file  Social Connections: Not on file  Intimate Partner Violence: Not At Risk (11/14/2022)   Received from [facility]   HITS    Over the last 12 months how often did your partner physically hurt you?: Never    Over the last 12 months how often did your partner insult you or talk down to you?: Never    Over the last 12 months how often did your partner threaten you with physical harm?:  Never    Over the last 12 months how often did your partner scream or curse at you?: Never    Outpatient Medications Prior to Visit  Medication Sig Dispense Refill   apixaban  (ELIQUIS ) 5 MG TABS tablet Take 1 tablet (5 mg total) by mouth 2 (two) times daily. 180 tablet 1   bisoprolol  (ZEBETA ) 5 MG tablet Take 1 tablet (5 mg total) by mouth daily. May take 1 additional tablet as needed for palpations     Cholecalciferol (VITAMIN D -3) 125 MCG (5000 UT) TABS Take 5,000 Units by mouth daily.     empagliflozin  (JARDIANCE ) 10 MG TABS tablet Take 1 tablet (10 mg total) by mouth daily before breakfast. 30 tablet 11   fluticasone -salmeterol (ADVAIR) 250-50 MCG/ACT AEPB Inhale 1 puff into the lungs every 12 (twelve) hours. 60 each 11   furosemide  (LASIX ) 20 MG tablet Take 1 tablet (20 mg total) by mouth daily as needed. 90 tablet 3   rosuvastatin  (CRESTOR ) 20 MG tablet Take 1 tablet (20 mg total) by mouth daily. 90 tablet 3   traZODone  (DESYREL ) 50 MG tablet TAKE 1 TABLET BY MOUTH AT BEDTIME AS  NEEDED 30 tablet 0   umeclidinium bromide  (INCRUSE ELLIPTA ) 62.5 MCG/ACT AEPB Inhale 1 puff into the lungs daily. 7 each 11   valsartan  (DIOVAN ) 40 MG tablet Take 0.5 tablets (20 mg total) by mouth daily. 45 tablet 2   VENTOLIN  HFA 108 (90 Base) MCG/ACT inhaler Inhale 1-2 puffs into the lungs every 4 (four) hours as needed for wheezing or shortness of breath. 18 g 11   No facility-administered medications prior to visit.    No Known Allergies  Review of Systems  Constitutional:  Negative for chills and fever.  HENT:  Negative for congestion, sinus pressure, sinus pain and sore throat.   Eyes:  Negative for pain and discharge.  Respiratory:  Negative for cough and shortness of breath.   Cardiovascular:  Negative for chest pain and palpitations.  Gastrointestinal:  Negative for abdominal pain, diarrhea, nausea and vomiting.  Endocrine: Negative for polydipsia and polyuria.  Genitourinary:  Negative for dysuria and hematuria.  Musculoskeletal:  Positive for back pain. Negative for neck pain and neck stiffness.  Skin:  Negative for rash.  Neurological:  Positive for numbness (Left foot (toes)). Negative for dizziness and weakness.  Psychiatric/Behavioral:  Negative for agitation and behavioral problems.        Objective:    Physical Exam Vitals reviewed.  Constitutional:      General: She is not in acute distress.    Appearance: She is not diaphoretic.  HENT:     Head: Normocephalic and atraumatic.     Nose: Nose normal.     Mouth/Throat:     Mouth: Mucous membranes are moist.     Pharynx: No posterior oropharyngeal erythema.  Eyes:     General: No scleral icterus.    Extraocular Movements: Extraocular movements intact.  Cardiovascular:     Rate and Rhythm: Normal rate and regular rhythm.     Heart sounds: Normal heart sounds. No murmur heard. Pulmonary:     Breath sounds: Normal breath sounds. No wheezing or rales.  Musculoskeletal:     Cervical back: Neck supple. No  tenderness.     Lumbar back: Tenderness (B/l paraspinal) present. Decreased range of motion. Positive right straight leg raise test. Negative left straight leg raise test.     Right lower leg: No edema.     Left lower leg: No edema.  Skin:    General: Skin is warm.     Findings: No rash.  Neurological:     General: No focal deficit present.     Mental Status: She is alert and oriented to person, place, and time.  Psychiatric:        Mood and Affect: Mood normal.        Behavior: Behavior normal.     BP 119/84   Pulse (!) 119   Ht 5' 4 (1.626 m)   Wt 146 lb 12.8 oz (66.6 kg)   SpO2 97%   BMI 25.20 kg/m  Wt Readings from Last 3 Encounters:  09/07/23 146 lb 12.8 oz (66.6 kg)  08/12/23 146 lb 8 oz (66.5 kg)  08/04/23 145 lb 9.6 oz (66 kg)        Assessment & Plan:   Problem List Items Addressed This Visit       Nervous and Auditory   Chronic low back pain with right-sided sciatica - Primary   Recent worsening of chronic low back pain Check x-ray of lumbar spine Has tried Tylenol  without much relief Unable to take oral NSAIDs, started Sterapred taper Laxative as needed for back muscle spasms Heating pad and lower back brace as needed Avoid heavy lifting and frequent bending Simple back exercises material provided      Relevant Medications   predniSONE  (STERAPRED UNI-PAK 21 TAB) 10 MG (21) TBPK tablet   cyclobenzaprine  (FLEXERIL ) 5 MG tablet   Other Relevant Orders   DG Lumbar Spine Complete     Meds ordered this encounter  Medications   predniSONE  (STERAPRED UNI-PAK 21 TAB) 10 MG (21) TBPK tablet    Sig: Take as package instructions.    Dispense:  1 each    Refill:  0   cyclobenzaprine  (FLEXERIL ) 5 MG tablet    Sig: Take 1 tablet (5 mg total) by mouth at bedtime.    Dispense:  30 tablet    Refill:  1     Jakhia Buxton MARLA Blanch, MD

## 2023-09-07 NOTE — Patient Instructions (Signed)
 Please take Prednisone  as prescribed.  Please take Flexeril  as needed for muscle spasms.  Please get X-ray of lumbar spine done at [facility].  Please perform simple back exercises at home as tolerated.

## 2023-09-07 NOTE — Assessment & Plan Note (Addendum)
 Recent worsening of chronic low back pain Check x-ray of lumbar spine Has tried Tylenol  without much relief Unable to take oral NSAIDs, started Sterapred taper Laxative as needed for back muscle spasms Heating pad and lower back brace as needed Avoid heavy lifting and frequent bending Simple back exercises material provided

## 2023-09-08 ENCOUNTER — Ambulatory Visit (HOSPITAL_COMMUNITY)
Admission: RE | Admit: 2023-09-08 | Discharge: 2023-09-08 | Disposition: A | Discharge status: Home / Self Care | Source: Ambulatory Visit | Attending: Internal Medicine

## 2023-09-08 ENCOUNTER — Ambulatory Visit (HOSPITAL_COMMUNITY)
Admission: RE | Admit: 2023-09-08 | Discharge: 2023-09-08 | Disposition: A | Discharge status: Home / Self Care | Source: Ambulatory Visit | Attending: Physician Assistant | Admitting: Physician Assistant

## 2023-09-08 DIAGNOSIS — I509 Heart failure, unspecified: Secondary | ICD-10-CM | POA: Diagnosis not present

## 2023-09-08 DIAGNOSIS — M5441 Lumbago with sciatica, right side: Secondary | ICD-10-CM | POA: Diagnosis present

## 2023-09-08 DIAGNOSIS — S3210XA Unspecified fracture of sacrum, initial encounter for closed fracture: Secondary | ICD-10-CM | POA: Diagnosis not present

## 2023-09-08 DIAGNOSIS — I493 Ventricular premature depolarization: Secondary | ICD-10-CM | POA: Insufficient documentation

## 2023-09-08 DIAGNOSIS — M48061 Spinal stenosis, lumbar region without neurogenic claudication: Secondary | ICD-10-CM | POA: Diagnosis not present

## 2023-09-08 DIAGNOSIS — M47816 Spondylosis without myelopathy or radiculopathy, lumbar region: Secondary | ICD-10-CM | POA: Diagnosis not present

## 2023-09-08 DIAGNOSIS — M5136 Other intervertebral disc degeneration, lumbar region with discogenic back pain only: Secondary | ICD-10-CM | POA: Diagnosis not present

## 2023-09-08 DIAGNOSIS — G8929 Other chronic pain: Secondary | ICD-10-CM | POA: Insufficient documentation

## 2023-09-08 LAB — ECHOCARDIOGRAM COMPLETE
Area-P 1/2: 2.5 cm2
S' Lateral: 3.45 cm

## 2023-09-08 NOTE — Progress Notes (Signed)
*  PRELIMINARY RESULTS* Echocardiogram 2D Echocardiogram has been performed.  Teresa Aida PARAS 09/08/2023, 3:47 PM

## 2023-09-12 ENCOUNTER — Other Ambulatory Visit: Payer: Self-pay | Admitting: Family Medicine

## 2023-09-12 DIAGNOSIS — G479 Sleep disorder, unspecified: Secondary | ICD-10-CM

## 2023-09-19 ENCOUNTER — Other Ambulatory Visit: Payer: Self-pay | Admitting: Internal Medicine

## 2023-09-19 ENCOUNTER — Telehealth: Payer: Self-pay

## 2023-09-19 ENCOUNTER — Ambulatory Visit: Payer: Self-pay | Admitting: Internal Medicine

## 2023-09-19 DIAGNOSIS — S3210XS Unspecified fracture of sacrum, sequela: Secondary | ICD-10-CM

## 2023-09-19 DIAGNOSIS — M51362 Other intervertebral disc degeneration, lumbar region with discogenic back pain and lower extremity pain: Secondary | ICD-10-CM

## 2023-09-19 NOTE — Telephone Encounter (Signed)
 Copied from CRM [id number]. Topic: Clinical - Lab/Test Results >> Sep 19, 2023  8:47 AM Dawna HERO wrote: Reason for CRM: read to her provider notes , pt states she agrees to the spine specialist evaluation

## 2023-09-20 ENCOUNTER — Encounter: Payer: Self-pay | Admitting: Internal Medicine

## 2023-10-10 NOTE — Progress Notes (Unsigned)
 Cardiology Office Note:  .   Date:  10/12/2023  ID:  Alison Kennedy, DOB May 13, 1965, MRN [id number] PCP: Alison Wilhelmena Lloyd Hilario, FNP  [facility] Providers Cardiologist:  Diannah SHAUNNA Maywood, MD Cardiology APP:  Sheron Lorette GRADE, PA-C {  History of Present Illness: .   Alison Kennedy is a 58 y.o. 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   Seen in heart care by me 06/09/2023.  Reported chronic unchanged CP since 11/2022, SOB with overexertion, skipping palpitations when laying down at night, and rare episodes of mild edema.  EKG showed NSR with PVC's. Ordered Jardiance  10 mg daily.  Ordered echo as below. Ordered Zio 06/2023 showed NSR, average HR 65, 3 brief runs of NSVT, 8 runs of nonsustained SVT, < 1% PAC, 16% PVC. Increased Bisoprolol  to 5 mg and decreased Valsartan  from 40 mg to 20 mg daily. Referred to EP.   Last seen in heartcare by EP 08/12/2023 with Dr. Waddell for evaluation of palpitations and PVC's.  Reviewed recent ZIO monitor.  Discussed ideal medication is flecainide but unable to use due to LV dysfunction.  Also discussed Mexitil but nausea is a common AE and decided to hold off. Recommended taking an additional bisoprolol  as needed.   ECHO 09/2023 showed EF 45 to 50%, mid septal/inferior wall hypokinesis, G1DD, mildly reduced RV systolic function, mild MV regurgitation  Today, notes burning chest pain resolved a couple weeks ago.  Reports unchanged SOB with minimal exertion and long distance that is relieved with rest. Since Dr. Waddell increased bisoprolol  to 5 mg, patient states palpitations are unchanged and still occurring at night.  Reports dizziness with bending over. She does not drink any water  and only drinks soda. BP  at home stable 110/70-80's. Reports compliance with medications.  Patient activity is limited due to SOB and arthritis.  Denies any LE edema, orthopnea, syncope.  Smokes 1 PPD and denies alcohol/drug use. Denies any recent hospitalizations or visits to the emergency department.   ROS: 10 point review of system has been reviewed and considered negative except ones been listed in the HPI.   Studies Reviewed: SABRA   ECHO 09/2023 IMPRESSIONS   1. Mild septal and inferior wall hypokinesis. . Left ventricular ejection  fraction, by estimation, is 45 to 50%. The left ventricle has mildly  decreased function. Left ventricular diastolic parameters are consistent  with Grade I diastolic dysfunction  (impaired relaxation).   2. Right ventricular systolic function is mildly reduced. The right  ventricular size is mildly enlarged.   3. The mitral valve is myxomatous. Mild mitral valve regurgitation.   4. The aortic valve is tricuspid. Aortic valve regurgitation is not  visualized. Aortic valve sclerosis/calcification is present, without any  evidence of aortic stenosis.   5. The inferior vena cava is normal in size with greater than 50%  respiratory variability, suggesting right atrial pressure of 3 mmHg.   Heart Monitor 06/2023   Patch wear time was for 13 days and 17 hours.   Normal sinus rhythm ranging from 50 to 103 bpm with an average HR 65 bpm.   3 runs of NSVT occurred, fastest interval lasting 4 beats and the longest interval lasting 6 beats.  8 runs of nonsustained SVT occurred, fastest interval lasting 11 beats and longest interval lasting 13 beats.   No evidence of atrial fibrillation/flutter, high-grade AV block or pauses.   <1% PAC burden and 16.1% PVC burden.  Ventricular bigeminy and trigeminy were present.   No patient triggered events were noted.  VQ perfusion IMPRESSION: Large perfusion defect within the RIGHT lower lobe consistent pulmonary embolism. Differential includes chronic or  acute pulmonary embolism. No comparison available.   These results will be called to the ordering clinician or representative by the Radiologist Assistant, and communication documented in the PACS or [employer].   ECHO IMPRESSIONS 04/2023  1. Left ventricular ejection fraction, by estimation, is 40 to 45%. Left  ventricular ejection fraction by 3D volume is 48 %. The left ventricle has  mildly decreased function. The left ventricle demonstrates global  hypokinesis. Left ventricular diastolic   parameters are consistent with Grade I diastolic dysfunction (impaired  relaxation).   2. Right ventricular systolic function is normal. The right ventricular  size is normal. Tricuspid regurgitation signal is inadequate for assessing  PA pressure.   3. The mitral valve is normal in structure. No evidence of mitral valve  regurgitation. No evidence of mitral stenosis.   4. The aortic valve is tricuspid. Aortic valve regurgitation is not  visualized. No aortic stenosis is present.   5. The inferior vena cava is normal in size with greater than 50%  respiratory variability, suggesting right atrial pressure of 3 mmHg.   Comparison(s): No prior Echocardiogram.    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  CONSTELLATION OF FINDINGS SUSPICIOUS FOR CONGESTIVE HEART FAILURE. SMALL VOLUME OF ASCITES IN THE UPPER ABDOMEN.   Physical Exam:   VS:  BP 134/88   Pulse 67   Ht 5' (1.524 m)   Wt 145 lb 9.6 oz (66 kg)   SpO2 94%   BMI 28.44 kg/m    Wt Readings from Last 3 Encounters:  10/12/23 145 lb 9.6 oz (66 kg)  09/07/23 146 lb 12.8 oz (66.6 kg)  08/12/23 146 lb 8 oz (66.5 kg)    GEN: Well nourished, well developed in no acute distress while sitting in chair.  NECK: No JVD; No carotid bruits CARDIAC: RRR, no murmurs, rubs, gallops RESPIRATORY:  Clear to auscultation without rales, wheezing or rhonchi   ABDOMEN: Soft, non-tender, non-distended EXTREMITIES:  No edema; No deformity   ASSESSMENT AND PLAN: .   Heart failure with mildly reduced ejection fraction (HFmrEF) (HCC) 04/2023 ECHO: EF 40-45%, mildly decreased LV function, global hypokinesis, G1DD. No previous ECHO for comparison.  Per Dr. Mallipeddi, scheduled for cath 05/12/2023 with Ganji, however canceled due to positive VQ scan and just focusing on PE treatment. See below.  ECHO 09/2023: EF 45 to 50%, mid septal/inferior wall hypokinesis, G1DD, mildly reduced RV systolic function, mild MV regurgitation Presented with chronic unchanged SOB. Denies edema, orthopnea, or PND. Appears Euvolemic on exam.  07/2023: K/Cr WNL.  Increase Bisoprolol  to 7.5 mg daily.  Continue on daily doses of Valsartan  20 mg, Jardiance  10 mg, Lasix  prn  Encouraged low sodium diet, fluid restriction <2L, and daily weights.  Educated to contact our office for weight gain of 2 lbs overnight or 5 lbs in one week. ED precautions discussed.    Pulmonary embolism without acute cor pulmonale, unspecified chronicity, unspecified pulmonary embolism type [facility]) Per Dr. Stacia request based on untreated PE from 11/24/2022 CTA, started on  Eliquis  5 mg twice daily and ordered VQ scan. VQ scan 05/09/2023 showed PE and patient was started on Eliquis  10 mg twice daily X 7 days then return to 5 mg twice daily dose.  Per Dr. Mallipeddi, schedule for CTA and cancel cardiac cath.  CTA 05/13/2023 suggested no acute PE and she was to continue with Eliquis  regimen.  Patient called on 05/25/2023 to notify office of streaks of blood in stool.  Follow-up CBC was normal and FOBT was positive.  She was continued on Eliquis  5 mg BID, which GI agreed.  Colonoscopy without any significant concerns.   Presented with chronic unchanged SOB.  No tachycardia, tachypnea or hypoxic on exam. Continue with Eliquis  5 mg BID (dose appropriate for age 60, weight 145 lbs, Cr WNL in 07/2023)    Atypical chest  pain Previously reported ongoing intermittent left side chest wall burning pain since 11/2022, not associated with exertion or position changes.  Today, reports no chest pain for a couple of weeks. Denies any exertional angina.  Discussed the chest pain was most likely related to PE. Advised if CP reoccurs then would recommended further ischemic evaluation.  PVC (premature ventricular contraction) Palpitations Zio 06/2023 showed NSR, average HR 65, 3 brief runs of NSVT, 8 runs of nonsustained SVT, < 1% PAC, 16% PVC. Increased Bisoprolol  to 5 mg and decreased Valsartan  from 40 mg to 20 mg daily. Referred to EP.  EP 08/12/2023 with Dr. Waddell: Discussed ideal medication is flecainide but unable to use due to LV dysfunction.  Also discussed Mexitil but nausea is a common AE and decided to hold off. Recommended taking an additional bisoprolol  as needed.  Since Dr. Waddell increased bisoprolol  to 5 mg, patient states palpitations are unchanged and still occurring at night.  Increase Bisoprolol  to 7.5 mg daily.  Order 2 week BP  & HR Log.  If no changes in palpations and BP/HR stable then would consider increasing bisoprolol  to 10 mg daily  If BP/HR unstable and improvement in palpitation then would d/c Valsartan  20 mg.   Dizziness  Reports dizziness with bending over. She does not drink any water  and only drinks soda.  Encourage to increase daily water  intake to at least 64 oz, reduce daily soda intake, and slow position changes.   Hyperlipidemia LDL goal <100 07/2023: LDL 49, LFT WNL  Continue Crestor  20 mg.   Tobacco use 1 PPD since 58 y/o.  Cessation encouraged but not interested at this time. Tried to patches and gums previously but unsuccessful.      Dispo: Follow up in 3 months with Scottie, PA-C  Signed, Nyshaun Standage Y Crislyn Willbanks, PA-C

## 2023-10-12 ENCOUNTER — Other Ambulatory Visit: Payer: Self-pay | Admitting: Family Medicine

## 2023-10-12 ENCOUNTER — Encounter: Payer: Self-pay | Admitting: Physician Assistant

## 2023-10-12 ENCOUNTER — Ambulatory Visit: Admitting: Physical Medicine and Rehabilitation

## 2023-10-12 ENCOUNTER — Encounter: Payer: Self-pay | Admitting: Physical Medicine and Rehabilitation

## 2023-10-12 ENCOUNTER — Ambulatory Visit: Attending: Student | Admitting: Physician Assistant

## 2023-10-12 VITALS — BP 134/88 | HR 67 | Ht 60.0 in | Wt 145.6 lb

## 2023-10-12 DIAGNOSIS — M5441 Lumbago with sciatica, right side: Secondary | ICD-10-CM | POA: Diagnosis not present

## 2023-10-12 DIAGNOSIS — I2699 Other pulmonary embolism without acute cor pulmonale: Secondary | ICD-10-CM | POA: Diagnosis not present

## 2023-10-12 DIAGNOSIS — R42 Dizziness and giddiness: Secondary | ICD-10-CM | POA: Insufficient documentation

## 2023-10-12 DIAGNOSIS — Z720 Tobacco use: Secondary | ICD-10-CM | POA: Diagnosis not present

## 2023-10-12 DIAGNOSIS — G479 Sleep disorder, unspecified: Secondary | ICD-10-CM

## 2023-10-12 DIAGNOSIS — G8929 Other chronic pain: Secondary | ICD-10-CM | POA: Diagnosis not present

## 2023-10-12 DIAGNOSIS — I493 Ventricular premature depolarization: Secondary | ICD-10-CM | POA: Insufficient documentation

## 2023-10-12 DIAGNOSIS — R0789 Other chest pain: Secondary | ICD-10-CM | POA: Insufficient documentation

## 2023-10-12 DIAGNOSIS — E785 Hyperlipidemia, unspecified: Secondary | ICD-10-CM | POA: Insufficient documentation

## 2023-10-12 DIAGNOSIS — M5416 Radiculopathy, lumbar region: Secondary | ICD-10-CM

## 2023-10-12 DIAGNOSIS — I5020 Unspecified systolic (congestive) heart failure: Secondary | ICD-10-CM | POA: Insufficient documentation

## 2023-10-12 MED ORDER — BISOPROLOL FUMARATE 5 MG PO TABS: 7.5000 mg | ORAL_TABLET | Freq: Every day | ORAL | 3 refills | Status: AC

## 2023-10-12 NOTE — Progress Notes (Signed)
 Core Outcome Measures Index (COMI) Back Score  Average Pain 8  COMI Score 80 %

## 2023-10-12 NOTE — Progress Notes (Signed)
 Pain Scale   Average Pain 8 Patient advising she has chronic lower back pain radiating to right leg standing and walking increases her pain and sitting decreases her pain.        +Driver, -BT, -Dye Allergies.

## 2023-10-12 NOTE — Patient Instructions (Signed)
 Medication Instructions:   Increase Bisoprolol  to 7.5 mg Daily   Monitor Blood Pressure and Heart Rate for 2 weeks and record the readings.   *If you need a refill on your cardiac medications before your next appointment, please call your pharmacy*  Lab Work: NONE   If you have labs (blood work) drawn today and your tests are completely normal, you will receive your results only by: MyChart Message (if you have MyChart) OR A paper copy in the mail If you have any lab test that is abnormal or we need to change your treatment, we will call you to review the results.  Testing/Procedures: NONE   Follow-Up: At [facility], you and your health needs are our priority.  As part of our continuing mission to provide you with exceptional heart care, our providers are all part of one team.  This team includes your primary Cardiologist (physician) and Advanced Practice Providers or APPs (Physician Assistants and Nurse Practitioners) who all work together to provide you with the care you need, when you need it.  Your next appointment:   3 month(s)  Provider:   Lorette Kapur, PA-C     We recommend signing up for the patient portal called MyChart.  Sign up information is provided on this After Visit Summary.  MyChart is used to connect with patients for Virtual Visits (Telemedicine).  Patients are able to view lab/test results, encounter notes, upcoming appointments, etc.  Non-urgent messages can be sent to your provider as well.   To learn more about what you can do with MyChart, go to [email or web address].   Other Instructions Thank you for choosing [facility]!

## 2023-10-12 NOTE — Progress Notes (Signed)
 Alison Kennedy - 58 y.o. female MRN [id number]  Date of birth: [birth year]  Office Visit Note: Visit Date: 10/12/2023 PCP: Terry Wilhelmena Lloyd Hilario, FNP Referred by: Del Orbe Polanco, Ilian*  Subjective: Chief Complaint  Patient presents with   Lower Back - Pain   HPI: Alison Kennedy is a 58 y.o. female who comes in today per the request of Dr. Suzzane Blanch for evaluation of chronic, worsening and severe bilateral lower back pain intermittent pain radiating down right leg. Pain ongoing for several years. Her pain worsens with activity and standing. Sitting seems to ease her pain significantly. She describes pain as sharp sensation, currently rates as 5 out of 10. Some relief of pain with home exercise regimen, rest and use of medications. No relief of pain with oral Prednisone  and Flexeril . She is unable to take NSAIDs due to stomach ulcer. No history of formal physical therapy. Recent lumbar radiographs show multi level degenerative changes and facet arthropathy. Facet arthropathy most severe at L4-L5. Age indeterminate transverse fracture of the distal sacrum. No prior MRI imaging of lumbar spine. No history of lumbar surgery/injection.   Patients course is complicated by alcohol and tobacco abuse, COPD and pulmonary embolism. She is currently taking Eliquis .      Review of Systems  Musculoskeletal:  Positive for back pain.  Neurological:  Negative for tingling, sensory change, focal weakness and weakness.  All other systems reviewed and are negative.  Otherwise per HPI.  Assessment & Plan: Visit Diagnoses:    ICD-10-CM   1. Chronic bilateral low back pain with right-sided sciatica  G89.29 MR LUMBAR SPINE WO CONTRAST   M54.41     2. Lumbar radiculopathy  M54.16 MR LUMBAR SPINE WO CONTRAST       Plan: Findings:  Chronic, worsening and severe bilateral lower back pain intermittent pain radiating down right leg. Bilateral lower back pain is biggest pain generator for her. She  continues to have severe pain despite good conservative therapies such as home exercise regimen, rest and use of medications. We discussed treatment plan in detail today. Next step is to place order for lumbar MRI imaging. Depending on results of MRI imaging we discussed possibility of performing epidural steroid injection. Patient voiced to me that she is not interested in spine injections at this time. She can continue with Tylenol  500 mg up to 3 times a day. I explained to her that with a stomach ulcer and currently taking Eliquis  I advise that she avoid NSAIDs and narcotic medications at this time. She inquired about Norco today, I informed her that we do not manage pain long term with narcotic medications, however we can place referral for chronic pain management. She has no further questions at this time. No red flag symptoms noted upon exam today.     Meds & Orders: No orders of the defined types were placed in this encounter.   Orders Placed This Encounter  Procedures   MR LUMBAR SPINE WO CONTRAST    Follow-up: Return for Lumbar MRI review.   Procedures: No procedures performed      Clinical History: Narrative & Impression EXAM: 4 VIEW(S) XRAY OF THE LUMBAR SPINE 09/08/2023 01:45:00 PM   COMPARISON: None available.   CLINICAL HISTORY: Chronic low back pain. Chronic lower back pain. No recent injury.   FINDINGS:   LUMBAR SPINE:   BONES: Age indeterminate transverse fracture of the distal sacrum noted. No acute fracture. No aggressive appearing osseous lesion. Alignment is normal.  DISCS AND DEGENERATIVE CHANGES: There is disc space narrowing at L3-4 and L4-5 in keeping with changes of mild degenerative disc disease. There is facet arthrosis noted bilaterally at L2-S1, not well characterized on this examination. This appears, however, most severe at L4-5 and is at least moderate in severity.   SOFT TISSUES: No acute abnormality.   IMPRESSION: 1. Mild degenerative  disc disease at L3-4 and L4-5. 2. Bilateral facet arthrosis at L2-S1, most severe at L4-5. 3. Age indeterminate transverse fracture of the distal sacrum. Correlation with prior examination or correlation for point tenderness may be helpful to determine acuity.   Electronically signed by: Dorethia Molt MD 09/16/2023 11:06 PM EDT RP Workstation: [id number]   She reports that she has been smoking cigarettes. She has a 30 pack-year smoking history. She has never used smokeless tobacco.  Recent Labs    04/21/23 1355  HGBA1C 5.3    Objective:  VS:  HT:    WT:   BMI:     BP:   HR: bpm  TEMP: ( )  RESP:  Physical Exam Vitals and nursing note reviewed.  HENT:     Head: Normocephalic and atraumatic.     Right Ear: External ear normal.     Left Ear: External ear normal.     Nose: Nose normal.     Mouth/Throat:     Mouth: Mucous membranes are moist.  Eyes:     Extraocular Movements: Extraocular movements intact.  Cardiovascular:     Rate and Rhythm: Normal rate.     Pulses: Normal pulses.  Pulmonary:     Effort: Pulmonary effort is normal.  Abdominal:     General: Abdomen is flat. There is no distension.  Musculoskeletal:        General: Tenderness present.     Cervical back: Normal range of motion.     Comments: Patient rises from seated position to standing without difficulty. Pain noted with facet loading and lumbar extension. 5/5 strength noted with bilateral hip flexion, knee flexion/extension, ankle dorsiflexion/plantarflexion and EHL. No clonus noted bilaterally. No pain upon palpation of greater trochanters. No pain with internal/external rotation of bilateral hips. Sensation intact bilaterally. Negative slump test bilaterally. Ambulates without aid, gait steady.     Skin:    General: Skin is warm and dry.     Capillary Refill: Capillary refill takes less than 2 seconds.  Neurological:     General: No focal deficit present.     Mental Status: She is alert and oriented  to person, place, and time.  Psychiatric:        Mood and Affect: Mood normal.        Behavior: Behavior normal.     Ortho Exam  Imaging: No results found.  Past Medical/Family/Surgical/Social History: Medications & Allergies reviewed per EMR, new medications updated. Patient Active Problem List   Diagnosis Date Noted   Chronic low back pain with right-sided sciatica 09/07/2023   Cervical cancer screening 06/17/2023   Encounter for routine adult physical exam with abnormal findings 04/21/2023   Difficulty sleeping 04/21/2023   COPD GOLD 3 12/23/2022   Pulmonary embolism (HCC) 12/23/2022   Cigarette smoker 12/23/2022   Congestive heart failure (CHF) (HCC) 11/17/2022   Alcoholism in remission (HCC) 09/23/2022   COPD with acute exacerbation (HCC) 09/23/2022   Major depression 05/17/2011   Alcohol causing toxic effect, subsequent encounter suicide attempt OD with trazadone  05/15/2011   Bereavement son suicided  05/15/2011   Obstructive lung disease (HCC)  03/19/2011   Nicotine  dependence 03/19/2011   Encounter for examination for driving license [id number]/7986   Alcohol intoxication 09/10/2010   Past Medical History:  Diagnosis Date   Alcohol abuse    COPD (chronic obstructive pulmonary disease) (HCC)    Depression    Heart failure (HCC)    Heart murmur    Hyperlipidemia    Family History  Problem Relation Age of Onset   COPD Father    Colon cancer Father        unsure age - passed in 67s   Asthma Brother    Hypertension Brother    Hyperlipidemia Brother    Heart disease Maternal Grandfather    COPD Maternal Grandfather    Heart murmur Daughter    Asthma Son    Colon cancer Maternal Aunt        s/p resection   Past Surgical History:  Procedure Laterality Date   COLONOSCOPY N/A 07/11/2023   Procedure: COLONOSCOPY;  Surgeon: Cindie Carlin POUR, DO;  Location: [facility];  Service: Endoscopy;  Laterality: N/A;  11:45 am, asa 3   DILATION AND CURETTAGE OF UTERUS      x2   EXPLORATION POST OPERATIVE OPEN HEART     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

## 2023-10-16 ENCOUNTER — Ambulatory Visit (HOSPITAL_COMMUNITY)

## 2023-10-27 ENCOUNTER — Ambulatory Visit (HOSPITAL_COMMUNITY)

## 2023-10-28 ENCOUNTER — Ambulatory Visit (INDEPENDENT_AMBULATORY_CARE_PROVIDER_SITE_OTHER): Admitting: Internal Medicine

## 2023-10-28 ENCOUNTER — Encounter: Payer: Self-pay | Admitting: Internal Medicine

## 2023-10-28 VITALS — BP 133/81 | HR 64 | Ht 60.0 in | Wt 149.2 lb

## 2023-10-28 DIAGNOSIS — R058 Other specified cough: Secondary | ICD-10-CM | POA: Insufficient documentation

## 2023-10-28 DIAGNOSIS — F17210 Nicotine dependence, cigarettes, uncomplicated: Secondary | ICD-10-CM

## 2023-10-28 DIAGNOSIS — J449 Chronic obstructive pulmonary disease, unspecified: Secondary | ICD-10-CM

## 2023-10-28 MED ORDER — AZITHROMYCIN 250 MG PO TABS: ORAL_TABLET | ORAL | 0 refills | Status: AC

## 2023-10-28 MED ORDER — ALBUTEROL SULFATE (2.5 MG/3ML) 0.083% IN NEBU: 2.5000 mg | INHALATION_SOLUTION | RESPIRATORY_TRACT | 12 refills | Status: AC | PRN

## 2023-10-28 NOTE — Patient Instructions (Signed)
 Work on inhaler technique:  relax and gently blow all the way out then take a nice smooth full deep breath back in, triggering the inhaler at same time you start breathing in.  Hold breath in for at least  5 seconds if you can.   If mucus gets nasty > zpak  and if cough gets worse consider symbioct 80 in place of adair (we can call it in for you)   Plan A = Automatic = Always=    Advair 250 one twice daily and Incruse one a day   Plan B = Backup (to supplement plan A, not to replace it) Use your albuterol  inhaler as a rescue medication to be used if you can't catch your breath by resting or slowing your pace  or doing a relaxed purse lip breathing pattern.  - The less you use it, the better it will work when you need it. - Ok to use the inhaler up to 2 puffs  every 4 hours if you must but call for appointment if use goes up over your usual need - Don't leave home without it !!  (think of it like the spare tire or starter fluid for your car)   Plan C = Crisis (instead of Plan B but only if Plan B stops working) - only use your albuterol  nebulizer if you first try Plan B and it fails to help > ok to use the nebulizer up to every 4 hours but if start needing it regularly call for immediate appointment  The key is to stop smoking completely before smoking completely stops you!       .Please schedule a follow up visit in 3 months but call sooner if needed

## 2023-10-28 NOTE — Assessment & Plan Note (Addendum)
 Active smoker/MM - Spirometry 03/19/2011  fev1 1.11L/46%, FVC 1.67L/58%, Ratio 67 - 12/23/2022  After extensive coaching inhaler device,  effectiveness =    hfa 25% and DPI 50% so try trelegy 200 samples x 12 weeks while awaiting medicaid approval.  - 03/14/2023 change to trelegy 100 each am and try off acei > made her cough  - 06/20/2023  After extensive coaching inhaler device,  effectiveness =    50% with hfa so return in 6 weeks with pfts and consider change to sym 160/spiriva as insurance won't cover bretri  - 06/20/2023   Walked on RA  x  3  lap(s) =  approx 450  ft  @ mod pace, stopped due to end of stud  with lowest 02 sats 94% and mild sob last lap   - PFT's  07/26/23  FEV1 0.88 (39 % ) ratio 0.65  p 29 % improvement from saba p advair prior to study with DLCO  7.40 (42%)   and FV curve mildly concave > added incruse     - Allergy screen 08/04/2023 >  Eos 0.1  And alpha one AT phenotype MM/ level 151    Group E in terms of symptoms/risk so  laba/lama/ICS  therefore appropriate rx at this point >>>  advair and incruse   and approp SABA prn.  10/28/2023  After extensive coaching inhaler device,  effectiveness =    75% with hfa so can use saba hfa approp and change over to symbicort 80 if cough becomes intolerable as likely the 2 DPI's used together may be contributing to UACS

## 2023-10-28 NOTE — Assessment & Plan Note (Addendum)
 Counseled re importance of smoking cessation but did not meet time criteria for separate billing

## 2023-10-28 NOTE — Progress Notes (Signed)
Alison Kennedy, female    DOB: Jan 27, 1965    MRN: [id number]   Brief patient profile:  56  yowf  active smoker  referred to pulmonary clinic in [place]  12/23/2022 by Alison Gibble NP  in [place]  for COPD f/u  arrived back in [place] Nov 2024   Spirometry 03/19/2011    - Fev1 1.11L/46%, FVC 1.67L/58%, Ratio 0. 67   History of Present Illness  12/23/2022  Pulmonary/ 1st office eval/ Alison Kennedy / [name]  Chief Complaint  Patient presents with   Establish Care   Shortness of Breath  Dyspnea:  food lion ok / no hc  Cough:  minimal dry  Sleep: bed is flat one pillow and awakens 3/7 nights due to cough  SABA use: avg 2 in 24 h  02: none  LDSCT:  CTa  11/24/22 neg nodules  Rec Plan A = Automatic = Always=    Anoro or Trelegy 200 one click  each am :  really tug on it to get all the medication out then rinse and gargle. Work on inhaler technique: Plan B = Backup (to supplement plan A, not to replace it) Only use your albuterol  inhaler as a rescue medication I will see what I can do to expedite cardiology follow up so we can get you on the right meds.   CTa 05/23/23 1. No evidence of pulmonary embolism to the segmental level. 2. Dilated main pulmonary arteries, right atrium, and reflux of contrast agent into the hepatic veins suggestive of pulmonary arterial hypertension. 3. Postsurgical changes from prior median sternotomy.  06/20/2023  f/u ov/[name]/Alison Kennedy re: COPD GOLD 3   maint on advair 250  / still smoking  Chief Complaint  Patient presents with   Follow-up    Shob - doe   COPD  Dyspnea:  food lion slow pace one aisle  Cough: after supper but not hs - worse with trelegy/ prefers advair   Sleeping: flat bed //  1 pillow s     resp cc  SABA use: late pm seems to help cough  02: none  Rec The key is to stop smoking completely before smoking completely stops you! Plan A = Automatic = Always=    Advair 250 one first thing in am and 12 hours later  Plan B =  Backup (to supplement plan A, not to replace it) Only use your albuterol  inhaler as a backup  medication  Also  Ok to try albuterol  15 min before an activity (on alternating days)  that you know would usually make you short of breath  Please schedule a follow up office visit in 6 weeks, call sooner if needed with all respiratory medications /inhalers/ solutions in hand  PFTs on return - don't use your inhalers the morning of the test     08/04/2023  f/u ov/[name]/Alison Kennedy re: COPD GOLD 3  maint on advair/ incruse  Chief Complaint  Patient presents with   Follow-up    PFT 7/22   COPD   Dyspnea:  MMRC2 = can't walk a nl pace on a flat grade s sob but does fine slow and flat food lion Cough: smoker's rattle, takes 10-15 min each am > nonproductive  Sleeping:  flat bed //  1 pillow  s  resp cc  SABA use: uses  2-3 days  02: none  Lung cancer screening: due 05/12/24 Rec Plan A = Automatic = Always=    Advair 250 followed by Incruse one  puff each then only the Advair 250 one click 12 hours later Plan B = Backup (to supplement plan A, not to replace it) Use your albuterol  inhaler as a rescue medication  Also  Ok to try albuterol  15 min before an activity (on alternating days)  that you know would usually make you short of breath Please schedule a follow up visit in 3 months but call sooner if needed - bring inhalers     10/28/2023  f/u ov/[name]/Alison Kennedy re: COPD GOLD 3  maint on advair  250  still smoking  Chief Complaint  Patient presents with   COPD    Struggling for breath recently    Dyspnea:  slow pace  at food lion ok  Cough: wakes up then starts coughing p a few min of stirring for 15 min before clears min white mucus Sleeping: flat bed one pillow s  resp cc  SABA use: none  02: none   No obvious day to day or daytime variability or assoc  purulent sputum or mucus plugs or hemoptysis or cp or chest tightness, subjective wheeze or overt sinus or hb symptoms.     Also denies any obvious fluctuation of symptoms with weather or environmental changes or other aggravating or alleviating factors except as outlined above   No unusual exposure hx or h/o childhood pna/ asthma or knowledge of premature birth.  Current Allergies, Complete Past Medical History, Past Surgical History, Family History, and Social History were reviewed in [employer] record.  ROS  The following are not active complaints unless bolded Hoarseness, sore throat, dysphagia, dental problems, itching, sneezing,  nasal congestion or discharge of excess mucus or purulent secretions, ear ache,   fever, chills, sweats, unintended wt loss or wt gain, classically pleuritic or exertional cp,  orthopnea pnd or arm/hand swelling  or leg swelling, presyncope, palpitations, abdominal pain, anorexia, nausea, vomiting, diarrhea  or change in bowel habits or change in bladder habits, change in stools or change in urine, dysuria, hematuria,  rash, arthralgias, visual complaints, headache, numbness, weakness or ataxia or problems with walking or coordination,  change in mood or  memory.        Current Meds  Medication Sig   apixaban  (ELIQUIS ) 5 MG TABS tablet Take 1 tablet (5 mg total) by mouth 2 (two) times daily.   bisoprolol  (ZEBETA ) 5 MG tablet Take 1.5 tablets (7.5 mg total) by mouth daily. May take 1 additional tablet as needed for palpations   Cholecalciferol (VITAMIN D -3) 125 MCG (5000 UT) TABS Take 5,000 Units by mouth daily.   cyclobenzaprine  (FLEXERIL ) 5 MG tablet Take 1 tablet (5 mg total) by mouth at bedtime.   empagliflozin  (JARDIANCE ) 10 MG TABS tablet Take 1 tablet (10 mg total) by mouth daily before breakfast.   fluticasone -salmeterol (ADVAIR) 250-50 MCG/ACT AEPB Inhale 1 puff into the lungs every 12 (twelve) hours.   furosemide  (LASIX ) 20 MG tablet Take 1 tablet (20 mg total) by mouth daily as needed.   predniSONE  (STERAPRED UNI-PAK 21 TAB) 10 MG (21) TBPK tablet Take  as package instructions.   rosuvastatin  (CRESTOR ) 20 MG tablet Take 1 tablet (20 mg total) by mouth daily.   traZODone  (DESYREL ) 50 MG tablet TAKE 1 TABLET BY MOUTH AT BEDTIME AS NEEDED   umeclidinium bromide  (INCRUSE ELLIPTA ) 62.5 MCG/ACT AEPB Inhale 1 puff into the lungs daily.   valsartan  (DIOVAN ) 40 MG tablet Take 0.5 tablets (20 mg total) by mouth daily.   VENTOLIN  HFA 108 (  90 Base) MCG/ACT inhaler Inhale 1-2 puffs into the lungs every 4 (four) hours as needed for wheezing or shortness of breath.                  Past Medical History:  Diagnosis Date   Alcohol abuse    Depression    Heart murmur       Objective:    Wts  10/28/2023     149  08/04/2023       145  06/20/2023       143   03/14/23 133 lb (60.3 kg)  12/23/22 134 lb (60.8 kg)  07/03/17 138 lb (62.6 kg)     Vital signs reviewed  10/28/2023  - Note at rest 02 sats  95% on RA   General appearance:  amb wf nad/   dry sounding harsh upper airway pattern cough      HEENT : Oropharynx  clear/dentition poor    Nasal turbinates nl    NECK :  without  apparent JVD/ palpable Nodes/TM    LUNGS: no acc muscle use,  Mild barrel  contour chest wall with bilateral  Distant bs s audible wheeze and  without cough on insp or exp maneuvers  and mild  Hyperresonant  to  percussion bilaterally     CV:  RRR  no s3 or murmur or increase in P2, and no edema   ABD:  soft and nontender   MS:  Nl gait/ ext warm without deformities Or obvious joint restrictions  calf tenderness, cyanosis or clubbing    SKIN: warm and dry without lesions    NEURO:  alert, approp, nl sensorium with  no motor or cerebellar deficits apparent.      Assessment   Assessment & Plan COPD GOLD 3 Active smoker/MM - Spirometry 03/19/2011  fev1 1.11L/46%, FVC 1.67L/58%, Ratio 67 - 12/23/2022  After extensive coaching inhaler device,  effectiveness =    hfa 25% and DPI 50% so try trelegy 200 samples x 12 weeks while awaiting medicaid approval.  -  03/14/2023 change to trelegy 100 each am and try off acei > made her cough  - 06/20/2023  After extensive coaching inhaler device,  effectiveness =    50% with hfa so return in 6 weeks with pfts and consider change to sym 160/spiriva as insurance won't cover bretri  - 06/20/2023   Walked on RA  x  3  lap(s) =  approx 450  ft  @ mod pace, stopped due to end of stud  with lowest 02 sats 94% and mild sob last lap   - PFT's  07/26/23  FEV1 0.88 (39 % ) ratio 0.65  p 29 % improvement from saba p advair prior to study with DLCO  7.40 (42%)   and FV curve mildly concave > added incruse     - Allergy screen 08/04/2023 >  Eos 0.1  And alpha one AT phenotype MM/ level 151    Group E in terms of symptoms/risk so  laba/lama/ICS  therefore appropriate rx at this point >>>  advair and incruse   and approp SABA prn.  10/28/2023  After extensive coaching inhaler device,  effectiveness =    75% with hfa so can use saba hfa approp and change over to symbicort 80 if cough becomes intolerable as likely the 2 DPI's used together may be contributing to UACS    Cigarette smoker Counseled re importance of smoking cessation but did not meet time criteria for  separate billing    Upper airway cough syndrome 1st reported 12/23/22 while on DPIs an no better p pred rx for back pain   Upper airway cough syndrome (previously labeled PNDS),  is so named because it's frequently impossible to sort out how much is  CR/sinusitis with freq throat clearing (which can be related to primary GERD)   vs  causing  secondary ( extra esophageal)  GERD from wide swings in gastric pressure that occur with throat clearing, often  promoting self use of mint and menthol lozenges that reduce the lower esophageal sphincter tone and exacerbate the problem further in a cyclical fashion.   These are the same pts (now being labeled as having irritable larynx syndrome by some cough centers) who not infrequently have a history of having failed to tolerate  ace inhibitors,  dry powder inhalers or biphosphonates or report having atypical/extraesophageal reflux symptoms from LPR (globus, throat clearing)  that don't respond to standard doses of PPI  and are easily confused as having aecopd or asthma flares by even experienced allergists/ pulmonologists (myself included).   Rx Zpak / d/c cigs/ call to change advair to symbiocrt 80 hfa  if cough not improving p above rx          Each maintenance medication was reviewed in detail including emphasizing most importantly the difference between maintenance and prns and under what circumstances the prns are to be triggered using an action plan format where appropriate.  Total time for H and P, chart review, counseling, reviewing hfa  device(s) and generating customized AVS unique to this office visit / same day charting = 34 min         AVS  Patient Instructions  Work on inhaler technique:  relax and gently blow all the way out then take a nice smooth full deep breath back in, triggering the inhaler at same time you start breathing in.  Hold breath in for at least  5 seconds if you can.   If mucus gets nasty > zpak  and if cough gets worse consider symbioct 80 in place of adair (we can call it in for you)   Plan A = Automatic = Always=    Advair 250 one twice daily and Incruse one a day   Plan B = Backup (to supplement plan A, not to replace it) Use your albuterol  inhaler as a rescue medication to be used if you can't catch your breath by resting or slowing your pace  or doing a relaxed purse lip breathing pattern.  - The less you use it, the better it will work when you need it. - Ok to use the inhaler up to 2 puffs  every 4 hours if you must but call for appointment if use goes up over your usual need - Don't leave home without it !!  (think of it like the spare tire or starter fluid for your car)   Plan C = Crisis (instead of Plan B but only if Plan B stops working) - only use your albuterol   nebulizer if you first try Plan B and it fails to help > ok to use the nebulizer up to every 4 hours but if start needing it regularly call for immediate appointment  The key is to stop smoking completely before smoking completely stops you!       .Please schedule a follow up visit in 3 months but call sooner if needed       Ozell America, MD  10/28/2023             

## 2023-10-28 NOTE — Assessment & Plan Note (Addendum)
 1st reported 12/23/22 while on DPIs an no better p pred rx for back pain   Upper airway cough syndrome (previously labeled PNDS),  is so named because it's frequently impossible to sort out how much is  CR/sinusitis with freq throat clearing (which can be related to primary GERD)   vs  causing  secondary ( extra esophageal)  GERD from wide swings in gastric pressure that occur with throat clearing, often  promoting self use of mint and menthol lozenges that reduce the lower esophageal sphincter tone and exacerbate the problem further in a cyclical fashion.   These are the same pts (now being labeled as having irritable larynx syndrome by some cough centers) who not infrequently have a history of having failed to tolerate ace inhibitors,  dry powder inhalers or biphosphonates or report having atypical/extraesophageal reflux symptoms from LPR (globus, throat clearing)  that don't respond to standard doses of PPI  and are easily confused as having aecopd or asthma flares by even experienced allergists/ pulmonologists (myself included).   Rx Zpak / d/c cigs/ call to change advair to symbiocrt 80 hfa  if cough not improving p above rx          Each maintenance medication was reviewed in detail including emphasizing most importantly the difference between maintenance and prns and under what circumstances the prns are to be triggered using an action plan format where appropriate.  Total time for H and P, chart review, counseling, reviewing hfa  device(s) and generating customized AVS unique to this office visit / same day charting = 34 min

## 2023-11-01 ENCOUNTER — Other Ambulatory Visit: Payer: Self-pay | Admitting: Physician Assistant

## 2023-11-01 NOTE — Telephone Encounter (Signed)
 Prescription refill request for Eliquis  received. Indication:pe Last office visit:10/25 Scr:0.83  7/25 Age: 58 Weight:67.7  kg  Prescription refilled

## 2023-11-14 ENCOUNTER — Ambulatory Visit
Admission: RE | Admit: 2023-11-14 | Discharge: 2023-11-14 | Disposition: A | Discharge status: Home / Self Care | Source: Ambulatory Visit | Attending: Physical Medicine and Rehabilitation | Admitting: Physical Medicine and Rehabilitation

## 2023-11-14 DIAGNOSIS — G8929 Other chronic pain: Secondary | ICD-10-CM

## 2023-11-14 DIAGNOSIS — M5416 Radiculopathy, lumbar region: Secondary | ICD-10-CM

## 2023-11-15 ENCOUNTER — Ambulatory Visit: Admitting: Student

## 2023-11-15 ENCOUNTER — Ambulatory Visit: Payer: Self-pay

## 2023-11-15 ENCOUNTER — Telehealth: Payer: Self-pay

## 2023-11-15 NOTE — Telephone Encounter (Signed)
Attempted to call no voicemail set up.  

## 2023-11-16 ENCOUNTER — Telehealth: Payer: Self-pay

## 2023-11-16 ENCOUNTER — Other Ambulatory Visit: Payer: Self-pay

## 2023-11-16 DIAGNOSIS — J449 Chronic obstructive pulmonary disease, unspecified: Secondary | ICD-10-CM

## 2023-11-16 NOTE — Telephone Encounter (Signed)
 Copied from CRM [id number]. Topic: Clinical - Order For Equipment >> Nov 15, 2023  8:26 AM Leila BROCKS wrote: Reason for CRM: Patient [phone] states Dr. Darlean prescribed nebulizer solution, but not the nebulizer machine, also wants the mask for the nebulizer machine. Please call back.   [address] - [place], [place] -  [address] [place] [address] Phone: [phone] Fax: [phone]   [facility] for me to order neb for pt ?

## 2023-11-16 NOTE — Telephone Encounter (Signed)
 Sent neb to dme

## 2023-11-21 NOTE — Telephone Encounter (Signed)
 Copied from CRM [id number]. Topic: Clinical - Order For Equipment >> Nov 15, 2023  8:26 AM Leila BROCKS wrote: Reason for CRM: Patient [phone] states Dr. Darlean prescribed nebulizer solution, but not the nebulizer machine, also wants the mask for the nebulizer machine. Please call back.   [address] - [place], [place] -  [address] [place] [address] Phone: [phone] Fax: [phone] >> Nov 21, 2023 10:20 AM Lavanda D wrote: Patient is calling because [place] states that they have not yet received an order for her nebulizer machine.    Order for neb was placed 11/12 and received 11/13 by [facility].  ATC x1 to let the pt know this. No answer- LDVM (DPR) stating neb order was received by [facility].

## 2023-11-23 ENCOUNTER — Encounter: Payer: Self-pay | Admitting: Internal Medicine

## 2023-12-05 NOTE — Telephone Encounter (Signed)
 Called and spoke with Alison Kennedy at [facility]---patient should be contacted today regarding her nebulizer.  LVM for patient with this information and requested return call tomorrow 12/06/23 if she still has not heard anything.

## 2023-12-05 NOTE — Telephone Encounter (Signed)
 Copied from CRM [id number]. Topic: Clinical - Order For Equipment >> Dec 05, 2023 10:54 AM Joesph PARAS wrote: Reason for CRM: Patient is calling to state she has heard nothing regarding nebulizer. Patient requesting update.  Order was sent to [facility] and patient states she has not heard anything from them.  Called and spoke with Madelin Boxer will check on the status and call me back

## 2023-12-19 ENCOUNTER — Ambulatory Visit: Admitting: Family Medicine

## 2024-01-10 NOTE — Progress Notes (Signed)
 " Cardiology Office Note:  .   Date:  01/12/2024  ID:  Alison Kennedy, DOB 1965/09/15, MRN [id number] PCP: Terry Wilhelmena Lloyd Hilario, FNP  [facility] Providers Cardiologist:  Diannah SHAUNNA Maywood, MD Cardiology APP:  Sheron Lorette GRADE, PA-C {  History of Present Illness: .   Alison Kennedy is a 59 y.o. female  with PMHx of HFmrEF, chronic PE, HLD, PVC, septal defect s/p repair (unclear if VSD vs ASD?), COPD, tobacco use  who reports to [facility] office for follow up.   Pertinent cardiac medical history:  HFmrEF  EF 40-45% in 04/2023 via echo with most recent ECHO 09/2023 showed EF 45 to 50%, mid septal/inferior wall hypokinesis, G1DD, mildly reduced RV systolic function, mild MV regurgitation  Chronic PE CT[facility] 11/2022 at [facility]: Chronic nonocclusive subsegmental PE within the right posterior basal segment Started on Eliquis  05/05/2023 VQ scan 05/09/2023: Large perfusion detected within the right lower lobe consistent with PE. CTA 05/13/2023 suggested no acute PE but continued with Eliquis  regimen.  05/25/2023: patient called to report blood in stool. F/U CBC WNL and FOBT positive. Referred to GI and Colonoscopy without any acute findings.   PVC  Zio 06/2023: NSR, average HR 65, 3 brief runs of NSVT, 8 runs of nonsustained SVT, < 1% PAC, 16% PVC). Referred to EP.  EP 08/12/2023 with Dr. Waddell: Discussed ideal medication is flecainide but unable to use due to LV dysfunction.  Also discussed Mexitil but nausea is a common AE and decided to hold off. Recommended taking an additional bisoprolol  as needed.   Last seen in heartcare 10/12/2023 by myself for follow up.  Reported unchanged SOB with minimal exertion and long distances that resolves at rest.  Also reported with increased bisoprolol  to 5 mg palpitations remained unchanged and still occurring at night.  Also reported dizziness with bending over.  Noted burning chest pain resolved.  Increased bisoprolol  to 7.5 mg daily.  Continued on valsartan  20  mg, Jardiance  10 mg, Lasix  as needed, Crestor  20 mg daily, Eliquis  5 mg BID  Today, reports overall doing well except for a dry cough and chest congestion; Mucinex  without improvement. Denies fevers, chills, or diarrhea. Suspects bronchitis given similar prior episodes and recent weather changes. Reports ongoing shortness of breath with minimal exertion, worse at night and with current bronchitis symptoms. She is scheduled to follow up with PCP to discuss.  Since bisoprolol  dose increase, palpitations have improved but still occur 1-2 times weekly at night lasting <5 minutes. Two recent episodes of lower-extremity swelling relieved with PRN Lasix ; has taken Lasix  for the past 4 days. Occasional dizziness when SBP in the 90s; otherwise SBP 120-130s. She only drinks Dr. Nunzio and still does not drink water .  Denies chest pain, syncope, orthopnea, PND, significant weight changes, bleeding, or claudication. Reports medication compliance. Activity limited by SOB and arthritis. Reduced tobacco use to <1 PPD; denies alcohol or drug use.  ROS: 10 point review of system has been reviewed and considered negative except ones been listed in the HPI.   Studies Reviewed: SABRA   CTA 11/2022 IMPRESSION:  CHRONIC APPEARING NONOCCLUSIVE SUBSEGMENTAL PULMONARY EMBOLISM WITHIN THE RIGHT POSTERIOR BASAL SEGMENT.  CONSTELLATION OF FINDINGS SUSPICIOUS FOR CONGESTIVE HEART FAILURE. SMALL VOLUME OF ASCITES IN THE UPPER ABDOMEN.   VQ perfusion IMPRESSION: Large perfusion defect within the RIGHT lower lobe consistent pulmonary embolism. Differential includes chronic or acute pulmonary embolism. No comparison available.   These results will be called to the ordering clinician or  representative by the Radiologist Assistant, and communication documented in the PACS or [employer].  Heart Monitor 06/2023   Patch wear time was for 13 days and 17 hours.   Normal sinus rhythm ranging from 50 to 103 bpm with an average HR  65 bpm.   3 runs of NSVT occurred, fastest interval lasting 4 beats and the longest interval lasting 6 beats.   8 runs of nonsustained SVT occurred, fastest interval lasting 11 beats and longest interval lasting 13 beats.   No evidence of atrial fibrillation/flutter, high-grade AV block or pauses.   <1% PAC burden and 16.1% PVC burden.  Ventricular bigeminy and trigeminy were present.   No patient triggered events were noted.  ECHO 09/2023 IMPRESSIONS   1. Mild septal and inferior wall hypokinesis. . Left ventricular ejection  fraction, by estimation, is 45 to 50%. The left ventricle has mildly  decreased function. Left ventricular diastolic parameters are consistent  with Grade I diastolic dysfunction  (impaired relaxation).   2. Right ventricular systolic function is mildly reduced. The right  ventricular size is mildly enlarged.   3. The mitral valve is myxomatous. Mild mitral valve regurgitation.   4. The aortic valve is tricuspid. Aortic valve regurgitation is not  visualized. Aortic valve sclerosis/calcification is present, without any  evidence of aortic stenosis.   5. The inferior vena cava is normal in size with greater than 50%  respiratory variability, suggesting right atrial pressure of 3 mmHg.   CV Studies: Cardiac studies reviewed are outlined and summarized above. Otherwise please see EMR for full report.   Physical Exam:   VS:  BP (!) 98/58 (BP Location: Left Arm, Cuff Size: Normal)   Pulse 72   SpO2 94%    Wt Readings from Last 3 Encounters:  10/28/23 149 lb 3.2 oz (67.7 kg)  10/12/23 145 lb 9.6 oz (66 kg)  09/07/23 146 lb 12.8 oz (66.6 kg)    GEN: Well nourished, well developed in no acute distress while sitting in chair.  NECK: No JVD; No carotid bruits CARDIAC: RRR, no murmurs, rubs, gallops RESPIRATORY:  Clear to auscultation without rales, wheezing or rhonchi  ABDOMEN: Soft, non-tender, non-distended EXTREMITIES:  No edema; No deformity   ASSESSMENT AND  PLAN: .    Heart failure with mildly reduced ejection fraction (HFmrEF) (HCC) ECHO 09/2023: EF 45 to 50%, mid septal/inferior wall hypokinesis, G1DD, mildly reduced RV systolic function, mild MV regurgitation Presented with chronic unchanged SOB except recently with bronchitis symptoms.  Occasional LE edema that is relieved with Lasix . Appears Euvolemic on exam.  07/2023: K/Cr WNL.  Due to soft BP, recommend decreasing Lasix  prn from 20 to 10 mg.  Continue on daily doses of Valsartan  20 mg, Jardiance  10 mg, Lasix  prn, bisoprolol  7.5 mg daily Encouraged low sodium diet, fluid restriction <2L, and daily weights.  Educated to contact our office for weight gain of 2 lbs overnight or 5 lbs in one week. ED precautions discussed.   Previously scheduled for cath per VM but canceled to focus on PE tx.    Pulmonary embolism without acute cor pulmonale, unspecified chronicity, unspecified pulmonary embolism type (HCC) Presented with chronic unchanged SOB.  No tachycardia, tachypnea or hypoxic on exam. Continue with Eliquis  5 mg BID (dose appropriate for age 59, weight 147 lbs, Cr WNL in 07/2023)   Atypical chest pain Previously reported ongoing intermittent left side chest wall burning pain since 11/2022, not associated with exertion or position changes.  Today, reports no chest  pain for a couple of weeks. Denies any exertional angina.  Discussed the chest pain was most likely related to PE. Advised if CP reoccurs then would recommended further ischemic evaluation.   PVC (premature ventricular contraction) Palpitations Zio 06/2023: 16% PVC.  Since bisoprolol  dose increase, palpitations have improved but still occur 1-2 times weekly at night lasting <5 minutes.  Continue bisoprolol  7.5 mg daily.  If worsening palpations and BP/HR stable then would consider increasing bisoprolol  to 10 mg daily  If BP/HR unstable and improvement in palpitation then would d/c Valsartan  20 mg.    Dizziness  Reports  occasional dizziness with SBP in 90s and has taken lasix  20 mg x4 days. Note mainly SBP in 120-130's.  BP in office: 98/58 with repeat BP 98/62. Denies any dizziness today. Due to soft BP, recommend decreasing Lasix  prn from 20 to 10 mg.  Offered to d/c Valsartan  for better BP control. Patient declines and prefers to monitor BP/HR for 2 weeks.  If BP remains soft then would encourage d/c valsartan .  Encourage to increase daily water  intake to at least 64 oz, reduce daily soda intake, and slow position changes.    Hyperlipidemia LDL goal <100 07/2023: LDL 49, LFT WNL  Continue Crestor  20 mg.    Tobacco use Reduced tobacco use to <1 PPD Cessation encouraged. She reports that she is working on it by reducing.     Dispo: Follow up in 3 months with Dr. Mallipeddi.   Signed, Lorette CINDERELLA Kapur, PA-C  "

## 2024-01-12 ENCOUNTER — Ambulatory Visit: Attending: Physician Assistant | Admitting: Physician Assistant

## 2024-01-12 ENCOUNTER — Encounter: Payer: Self-pay | Admitting: Physician Assistant

## 2024-01-12 VITALS — BP 98/58 | HR 72 | Wt 147.0 lb

## 2024-01-12 DIAGNOSIS — I2699 Other pulmonary embolism without acute cor pulmonale: Secondary | ICD-10-CM | POA: Insufficient documentation

## 2024-01-12 DIAGNOSIS — I493 Ventricular premature depolarization: Secondary | ICD-10-CM | POA: Insufficient documentation

## 2024-01-12 DIAGNOSIS — R42 Dizziness and giddiness: Secondary | ICD-10-CM | POA: Diagnosis present

## 2024-01-12 DIAGNOSIS — Z720 Tobacco use: Secondary | ICD-10-CM | POA: Insufficient documentation

## 2024-01-12 DIAGNOSIS — E785 Hyperlipidemia, unspecified: Secondary | ICD-10-CM | POA: Insufficient documentation

## 2024-01-12 DIAGNOSIS — R0789 Other chest pain: Secondary | ICD-10-CM | POA: Diagnosis present

## 2024-01-12 DIAGNOSIS — R002 Palpitations: Secondary | ICD-10-CM | POA: Diagnosis present

## 2024-01-12 DIAGNOSIS — I5020 Unspecified systolic (congestive) heart failure: Secondary | ICD-10-CM | POA: Diagnosis present

## 2024-01-12 NOTE — Patient Instructions (Signed)
 Medication Instructions:  Your physician recommends that you continue on your current medications as directed. Please refer to the Current Medication list given to you today.   Labwork: None today  Testing/Procedures: None today  Follow-Up: 3 months Dr.Mallipeddi  Any Other Special Instructions Will Be Listed Below (If Applicable).   Increase your daily water  intake  Keep a blood pressure and heart rate log for 2 weeks  If you need a refill on your cardiac medications before your next appointment, please call your pharmacy.

## 2024-01-30 ENCOUNTER — Ambulatory Visit: Admitting: Internal Medicine

## 2024-02-09 ENCOUNTER — Ambulatory Visit: Admitting: Internal Medicine

## 2024-02-09 DIAGNOSIS — R058 Other specified cough: Secondary | ICD-10-CM

## 2024-02-09 DIAGNOSIS — J449 Chronic obstructive pulmonary disease, unspecified: Secondary | ICD-10-CM

## 2024-02-09 NOTE — Progress Notes (Unsigned)
 "   Alison Kennedy, female    DOB: [birth year]    MRN: [id number]   Brief patient profile:  51  yowf  active smoker  referred to pulmonary clinic in [place]  12/23/2022 by Jordan Gibble NP  in [place]  for COPD f/u  arrived back in [place] Nov 2024   Spirometry 03/19/2011    - Fev1 1.11L/46%, FVC 1.67L/58%, Ratio 0. 67   History of Present Illness  12/23/2022  Pulmonary/ 1st office eval/ Lukah Goswami / [place] Office  Chief Complaint  Patient presents with   Establish Care   Shortness of Breath  Dyspnea:  food lion ok / no hc  Cough:  minimal dry  Sleep: bed is flat one pillow and awakens 3/7 nights due to cough  SABA use: avg 2 in 24 h  02: none  LDSCT:  CTa  11/24/22 neg nodules  Rec Plan A = Automatic = Always=    Anoro or Trelegy 200 one click  each am :  really tug on it to get all the medication out then rinse and gargle. Work on inhaler technique: Plan B = Backup (to supplement plan A, not to replace it) Only use your albuterol  inhaler as a rescue medication I will see what I can do to expedite cardiology follow up so we can get you on the right meds.   CTa 05/23/23 1. No evidence of pulmonary embolism to the segmental level. 2. Dilated main pulmonary arteries, right atrium, and reflux of contrast agent into the hepatic veins suggestive of pulmonary arterial hypertension. 3. Postsurgical changes from prior median sternotomy.    08/04/2023  f/u ov/[place] office/[employer] re: COPD GOLD 3  maint on advair/ incruse  Chief Complaint  Patient presents with   Follow-up    PFT 7/22   COPD   Dyspnea:  MMRC2 = can't walk a nl pace on a flat grade s sob but does fine slow and flat food lion Cough: smoker's rattle, takes 10-15 min each am > nonproductive  Sleeping:  flat bed //  1 pillow  s  resp cc  SABA use: uses  2-3 days  02: none  Lung cancer screening: due 05/12/24 Rec Plan A = Automatic = Always=    Advair 250 followed by Incruse one puff each then only the Advair 250 one  click 12 hours later Plan B = Backup (to supplement plan A, not to replace it) Use your albuterol  inhaler as a rescue medication  Also  Ok to try albuterol  15 min before an activity (on alternating days)  that you know would usually make you short of breath Please schedule a follow up visit in 3 months but call sooner if needed - bring inhalers     10/28/2023  f/u ov/[place] office/[place]onato re: COPD GOLD 3  maint on advair  250  still smoking  Chief Complaint  Patient presents with   COPD    Struggling for breath recently    Dyspnea:  slow pace  at food lion ok  Cough: wakes up then starts coughing p a few min of stirring for 15 min before clears min white mucus Sleeping: flat bed one pillow s  resp cc  SABA use: none  02: none  Patient Instructions  Work on inhaler technique:  relax and gently blow all the way out then take a nice smooth full deep breath back in, triggering the inhaler at same time you start breathing in.  Hold breath in for at least  5 seconds if you can.  If mucus gets nasty > zpak  and if cough gets worse consider symbioct 80 in place of adair (we can call it in for you)  Plan A = Automatic = Always=    Advair 250 one twice daily and Incruse one a day  Plan B = Backup (to supplement plan A, not to replace it) Use your albuterol  inhaler as a rescue medication Plan C = Crisis (instead of Plan B but only if Plan B stops working) - only use your albuterol  nebulizer if you first try Plan B and it fails to help  The key is to stop smoking completely before smoking completely stops you   02/09/2024  f/u ov/[place] office/Otha Monical re: GOLD 3 copd/ UACS/still smoking maint on ***  No chief complaint on file.   Dyspnea:  *** Cough: *** Sleeping: ***   resp cc  SABA use: *** 02: ***  Lung cancer screening: ***   No obvious day to day or daytime variability or assoc excess/ purulent sputum or mucus plugs or hemoptysis or cp or chest tightness, subjective wheeze or overt  sinus or hb symptoms.    Also denies any obvious fluctuation of symptoms with weather or environmental changes or other aggravating or alleviating factors except as outlined above   No unusual exposure hx or h/o childhood pna/ asthma or knowledge of premature birth.  Current Allergies, Complete Past Medical History, Past Surgical History, Family History, and Social History were reviewed in [employer] record.  ROS  The following are not active complaints unless bolded Hoarseness, sore throat, dysphagia, dental problems, itching, sneezing,  nasal congestion or discharge of excess mucus or purulent secretions, ear ache,   fever, chills, sweats, unintended wt loss or wt gain, classically pleuritic or exertional cp,  orthopnea pnd or arm/hand swelling  or leg swelling, presyncope, palpitations, abdominal pain, anorexia, nausea, vomiting, diarrhea  or change in bowel habits or change in bladder habits, change in stools or change in urine, dysuria, hematuria,  rash, arthralgias, visual complaints, headache, numbness, weakness or ataxia or problems with walking or coordination,  change in mood or  memory.         Outpatient Medications Prior to Visit  Medication Sig Dispense Refill   albuterol  (PROVENTIL ) (2.5 MG/3ML) 0.083% nebulizer solution Take 3 mLs (2.5 mg total) by nebulization every 4 (four) hours as needed. 75 mL 12   apixaban  (ELIQUIS ) 5 MG TABS tablet Take 1 tablet by mouth twice daily 180 tablet 1   azithromycin  (ZITHROMAX ) 250 MG tablet Take 2 on day one then 1 daily x 4 days 6 tablet 0   bisoprolol  (ZEBETA ) 5 MG tablet Take 1.5 tablets (7.5 mg total) by mouth daily. May take 1 additional tablet as needed for palpations 135 tablet 3   Cholecalciferol (VITAMIN D -3) 125 MCG (5000 UT) TABS Take 5,000 Units by mouth daily.     cyclobenzaprine  (FLEXERIL ) 5 MG tablet Take 1 tablet (5 mg total) by mouth at bedtime. 30 tablet 1   empagliflozin  (JARDIANCE ) 10 MG TABS tablet  Take 1 tablet (10 mg total) by mouth daily before breakfast. 30 tablet 11   fluticasone -salmeterol (ADVAIR) 250-50 MCG/ACT AEPB Inhale 1 puff into the lungs every 12 (twelve) hours. 60 each 11   furosemide  (LASIX ) 20 MG tablet Take 1 tablet (20 mg total) by mouth daily as needed. 90 tablet 3   predniSONE  (STERAPRED UNI-PAK 21 TAB) 10 MG (21) TBPK tablet Take as package instructions.  1 each 0   rosuvastatin  (CRESTOR ) 20 MG tablet Take 1 tablet (20 mg total) by mouth daily. 90 tablet 3   traZODone  (DESYREL ) 50 MG tablet TAKE 1 TABLET BY MOUTH AT BEDTIME AS NEEDED 30 tablet 0   umeclidinium bromide  (INCRUSE ELLIPTA ) 62.5 MCG/ACT AEPB Inhale 1 puff into the lungs daily. 7 each 11   valsartan  (DIOVAN ) 40 MG tablet Take 0.5 tablets (20 mg total) by mouth daily. 45 tablet 2   VENTOLIN  HFA 108 (90 Base) MCG/ACT inhaler Inhale 1-2 puffs into the lungs every 4 (four) hours as needed for wheezing or shortness of breath. 18 g 11   No facility-administered medications prior to visit.            Past Medical History:  Diagnosis Date   Alcohol abuse    Depression    Heart murmur       Objective:    Wts  02/09/2024           ***  10/28/2023     149  08/04/2023       145  06/20/2023       143   03/14/23 133 lb (60.3 kg)  12/23/22 134 lb (60.8 kg)  07/03/17 138 lb (62.6 kg)   Vital signs reviewed  02/09/2024  - Note at rest 02 sats  ***% on ***   General appearance:    ***      Mild barr***    Assessment                     "

## 2024-02-20 ENCOUNTER — Ambulatory Visit: Admitting: Internal Medicine

## 2024-03-16 ENCOUNTER — Ambulatory Visit: Payer: Self-pay

## 2024-04-06 ENCOUNTER — Ambulatory Visit: Admitting: Internal Medicine
